# Patient Record
Sex: Female | Born: 1970 | Race: White | Hispanic: No | Marital: Single | Smoking: Never smoker
Health system: Southern US, Community
[De-identification: ages and names within clinical notes are randomized; demographics above are authoritative.]

## PROBLEM LIST (undated history)

## (undated) DIAGNOSIS — R413 Other amnesia: Secondary | ICD-10-CM

## (undated) DIAGNOSIS — G4730 Sleep apnea, unspecified: Secondary | ICD-10-CM

## (undated) DIAGNOSIS — Z87442 Personal history of urinary calculi: Secondary | ICD-10-CM

## (undated) DIAGNOSIS — F419 Anxiety disorder, unspecified: Secondary | ICD-10-CM

## (undated) DIAGNOSIS — F329 Major depressive disorder, single episode, unspecified: Secondary | ICD-10-CM

## (undated) DIAGNOSIS — R600 Localized edema: Secondary | ICD-10-CM

## (undated) DIAGNOSIS — G8929 Other chronic pain: Secondary | ICD-10-CM

## (undated) DIAGNOSIS — U071 COVID-19: Secondary | ICD-10-CM

## (undated) DIAGNOSIS — Z8489 Family history of other specified conditions: Secondary | ICD-10-CM

## (undated) DIAGNOSIS — F32A Depression, unspecified: Secondary | ICD-10-CM

## (undated) DIAGNOSIS — R7989 Other specified abnormal findings of blood chemistry: Secondary | ICD-10-CM

## (undated) DIAGNOSIS — E785 Hyperlipidemia, unspecified: Secondary | ICD-10-CM

## (undated) DIAGNOSIS — L988 Other specified disorders of the skin and subcutaneous tissue: Secondary | ICD-10-CM

## (undated) HISTORY — PX: NO PAST SURGERIES: SHX2092

## (undated) HISTORY — PX: COLONOSCOPY: SHX174

## (undated) HISTORY — PX: WRIST GANGLION EXCISION: SHX840

## (undated) HISTORY — PX: CYSTOSCOPY WITH HOLMIUM LASER LITHOTRIPSY: SHX6639

## (undated) HISTORY — PX: BREAST REDUCTION SURGERY: SHX8

---

## 2008-09-17 ENCOUNTER — Ambulatory Visit: Payer: Self-pay | Admitting: Internal Medicine

## 2011-05-29 ENCOUNTER — Ambulatory Visit: Payer: Self-pay | Admitting: Internal Medicine

## 2013-07-05 ENCOUNTER — Ambulatory Visit: Payer: Self-pay | Admitting: Obstetrics and Gynecology

## 2013-07-05 LAB — CBC
HCT: 35.4 % (ref 35.0–47.0)
HGB: 12 g/dL (ref 12.0–16.0)
MCH: 30.4 pg (ref 26.0–34.0)
MCV: 90 fL (ref 80–100)
Platelet: 283 10*3/uL (ref 150–440)
RBC: 3.95 10*6/uL (ref 3.80–5.20)
RDW: 13.8 % (ref 11.5–14.5)

## 2013-07-05 LAB — COMPREHENSIVE METABOLIC PANEL
Albumin: 3.8 g/dL (ref 3.4–5.0)
Alkaline Phosphatase: 88 U/L (ref 50–136)
Anion Gap: 4 — ABNORMAL LOW (ref 7–16)
BUN: 11 mg/dL (ref 7–18)
Calcium, Total: 8.9 mg/dL (ref 8.5–10.1)
Chloride: 105 mmol/L (ref 98–107)
Creatinine: 0.73 mg/dL (ref 0.60–1.30)
EGFR (Non-African Amer.): >60
Glucose: 90 mg/dL (ref 65–99)
SGPT (ALT): 30 U/L (ref 12–78)

## 2013-07-20 ENCOUNTER — Ambulatory Visit: Payer: Self-pay | Admitting: Obstetrics and Gynecology

## 2013-07-23 LAB — PATHOLOGY REPORT

## 2014-01-10 ENCOUNTER — Ambulatory Visit: Payer: Self-pay | Admitting: Emergency Medicine

## 2014-01-10 LAB — RAPID STREP-A WITH REFLX: Micro Text Report: NEGATIVE

## 2014-01-12 LAB — BETA STREP CULTURE(ARMC)

## 2015-01-03 NOTE — Op Note (Signed)
PATIENT NAME:  Rafael BihariDAY, Corlis C MR#:  [id number] DATE OF BIRTH:  23-Dec-1970  DATE OF PROCEDURE:  07/20/2013  PREOPERATIVE DIAGNOSES:  1.  Abnormal uterine bleeding.  2.  Suspected endometrial polyp.   POSTOPERATIVE DIAGNOSES: 1.  Abnormal uterine bleeding.  2.  Suspected endometrial polyp.   PROCEDURE:  1.  Hysteroscopy.  2.  Dilation and curettage.  3.  Polypectomy.   SURGEON: Thomasene MohairStephen Star Cheese, MD.   ANESTHESIA: General.   ESTIMATED BLOOD LOSS: 50 mL.  OPERATIVE FLUIDS: 900 mL crystalloid.   COMPLICATIONS: None.   FINDINGS:  1.  Multiple polypoid fragments in endometrial cavity.  2.  Normal-appearing uterine cavity contour.   SPECIMENS:  1.  Endometrial curettings.  2.  Polypoid lesions.   CONDITION AT THE END OF PROCEDURE: Stable.   PROCEDURE IN DETAIL: The patient was taken to the operating room where general anesthesia was administered and found to be adequate. She was placed in the dorsal supine high lithotomy position in the candy cane stirrups and prepped and draped in the usual sterile fashion. After timeout was called, her bladder was drained using straight catheterization with an in and out catheter for about 150 mL of clear urine. A sterile speculum placed in the vagina and the anterior lip of the cervix was grasped with a single-tooth tenaculum. The uterus was sounded to the depth of approximately 7 cm. The cervix was gently dilated in a serial fashion using Hegar dilators to a dilatation of approximately 8 mm. The hysteroscope was then gently introduced through the cervix with the above-noted findings. Multiple passes using the curette as well as a Randall stone forceps were utilized to clear the uterine cavity of all polypoid fragments noted. At the end of the procedure, no polypoid fragments remained and hemostasis was noted. At this point, the procedure was terminated. The hysteroscope was removed from the uterus. The tenaculum was removed from the cervix and hemostasis  was found at the tenaculum sites. The speculum was also removed from the vagina and the vagina was verified to be free of any instrumentation or sponges at the end of procedure.   The patient tolerated the procedure well. Sponge, lap, and needle counts were correct x 2. The patient was wearing pneumatic compression stockings for VTE prophylaxis throughout the entire procedure. She was awakened in the operating room and taken to the recovery area in stable condition.   ____________________________ Conard NovakStephen D. Raiza Kiesel, MD sdj:aw D: 07/20/2013 11:52:11 ET T: 07/20/2013 12:11:55 ET JOB#: [id number]  cc: Conard NovakStephen D. Vince Ainsley, MD, <Dictator> Conard NovakSTEPHEN D Olar Santini MD ELECTRONICALLY SIGNED 07/29/2013 11:57

## 2016-06-25 DIAGNOSIS — R0602 Shortness of breath: Secondary | ICD-10-CM | POA: Insufficient documentation

## 2016-06-25 DIAGNOSIS — F439 Reaction to severe stress, unspecified: Secondary | ICD-10-CM | POA: Insufficient documentation

## 2016-06-25 DIAGNOSIS — M7661 Achilles tendinitis, right leg: Secondary | ICD-10-CM | POA: Insufficient documentation

## 2018-01-04 DIAGNOSIS — E785 Hyperlipidemia, unspecified: Secondary | ICD-10-CM | POA: Insufficient documentation

## 2018-05-17 ENCOUNTER — Other Ambulatory Visit: Payer: Self-pay

## 2018-05-17 ENCOUNTER — Ambulatory Visit
Admission: EM | Admit: 2018-05-17 | Discharge: 2018-05-17 | Disposition: A | Discharge status: Home / Self Care | Payer: BC Managed Care – PPO | Attending: Family Medicine | Admitting: Family Medicine

## 2018-05-17 DIAGNOSIS — J40 Bronchitis, not specified as acute or chronic: Secondary | ICD-10-CM

## 2018-05-17 MED ORDER — HYDROCOD POLST-CPM POLST ER 10-8 MG/5ML PO SUER: 5.0000 mL | Freq: Two times a day (BID) | ORAL | 0 refills | Status: DC | PRN

## 2018-05-17 MED ORDER — AZITHROMYCIN 250 MG PO TABS: ORAL_TABLET | ORAL | 0 refills | Status: DC

## 2018-05-17 NOTE — Discharge Instructions (Signed)
Meds as prescribed.  If you fail to improve or worsen, please let us know  Take care  Dr. Adriana Simas

## 2018-05-17 NOTE — ED Provider Notes (Signed)
[facility]    CSN: [id number] Arrival date & time: 05/17/18  1303  History   Chief Complaint Chief Complaint  Patient presents with  . Cough   HPI  47-year-old female presents with cough and fever.  Started Thursday.  She reports cough, congestion, wheezing.  Has had a fever as high as 103.  Has been medicating with Tylenol, NyQuil and DayQuil.  Cough persists.  No reports of shortness of breath.  No known exacerbating factors.  No reported sick contacts.  No other associated symptoms.  No other complaints.  PMH, Surgical Hx, Family Hx, Social History reviewed and updated as below.  PMH: Anxiety    Ganglion cyst    Joint pain    Depression    Uterine polyp ~2012 D&C  Family history of breast cancer  Patien'ts mother had negative BRCA gene testing. Patient had genetic counseling and told no indication for testing.  Nephrolithiasis  Surgical Hx: BREAST SURGERY   reduction   COMBINED HYSTEROSCOPY DIAGNOSTIC / D&C       Home Medications    Prior to Admission medications   Medication Sig Start Date End Date Taking? Authorizing Provider  clonazePAM (KLONOPIN) 0.5 MG tablet TK 1 T PO BID PRA 05/03/18  Yes [provider]  escitalopram (LEXAPRO) 10 MG tablet TK 1 T PO D 04/21/18  Yes [provider]  naproxen (NAPROSYN) 500 MG tablet TK 1 T PO BID PRN 05/02/18  Yes [provider]  azithromycin (ZITHROMAX) 250 MG tablet 2 tablets on [place] 1, then 1 tablet daily on days 2-5. 05/17/18   ,  G, DO  chlorpheniramine-HYDROcodone (TUSSIONEX PENNKINETIC ER) 10-8 MG/5ML SUER Take 5 mLs by mouth every 12 (twelve) hours as needed. 05/17/18   ,  G, DO    Family History Family History  Problem Relation Age of Onset  . Cancer Mother   . Diabetes Father   . Heart attack Father     Social History Social History   Tobacco Use  . Smoking status: Never Smoker  . Smokeless tobacco: Never Used  Substance Use Topics  . Alcohol  use: Yes    Comment: occasionally  . Drug use: Not Currently     Allergies   Penicillin g and Bee venom   Review of Systems Review of Systems  Constitutional: Positive for fever.  HENT: Positive for congestion.   Respiratory: Positive for cough.    Physical Exam Triage Vital Signs ED Triage Vitals  Enc Vitals Group     BP 05/17/18 1316 (!) 141/85     Pulse Rate 05/17/18 1316 87     Resp 05/17/18 1316 18     Temp 05/17/18 1316 98.4 F (36.9 C)     Temp Source 05/17/18 1316 Oral     SpO2 05/17/18 1316 99 %     Weight 05/17/18 1315 252 lb (114.3 kg)     Height 05/17/18 1315 5' 8" (1.727 m)     Head Circumference --      Peak Flow --      Pain Score 05/17/18 1315 4     Pain Loc --      Pain Edu? --      Excl. in GC? --    Updated Vital Signs BP (!) 141/85 (BP Location: Left Arm)   Pulse 87   Temp 98.4 F (36.9 C) (Oral)   Resp 18   Ht 5' 8" (1.727 m)   Wt 114.3 kg   SpO2 99%     BMI 38.32 kg/m   Visual Acuity Right Eye Distance:   Left Eye Distance:   Bilateral Distance:    Right Eye Near:   Left Eye Near:    Bilateral Near:     Physical Exam  Constitutional: She is oriented to person, place, and time. She appears well-developed. No distress.  HENT:  Head: Normocephalic and atraumatic.  Mouth/Throat: Oropharynx is clear and moist.  Cardiovascular: Normal rate and regular rhythm.  Pulmonary/Chest: Effort normal. No respiratory distress. She has no wheezes. She has no rales.  Neurological: She is alert and oriented to person, place, and time.  Psychiatric: She has a normal mood and affect. Her behavior is normal.  Nursing note and vitals reviewed.  UC Treatments / Results  Labs (all labs ordered are listed, but only abnormal results are displayed) Labs Reviewed - No data to display  EKG None  Radiology No results found.  Procedures Procedures (including critical care time)  Medications Ordered in UC Medications - No data to display  Initial  Impression / Assessment and Plan / UC Course  I have reviewed the triage vital signs and the nursing notes.  Pertinent labs & imaging results that were available during my care of the patient were reviewed by me and considered in my medical decision making (see chart for details).    47 year old female presents with suspected bacterial bronchitis versus early pneumonia.  Treating with azithromycin.  Tussionex for cough.  Final Clinical Impressions(s) / UC Diagnoses   Final diagnoses:  Bronchitis     Discharge Instructions     Meds as prescribed.  If you fail to improve or worsen, please let us know  Take care  Dr. Lacinda Axon    ED Prescriptions    Medication Sig [place]. Provider   azithromycin (ZITHROMAX) 250 MG tablet 2 tablets on [place] 1, then 1 tablet daily on days 2-5. 6 tablet Maydelin Deming G, DO   chlorpheniramine-HYDROcodone (TUSSIONEX PENNKINETIC ER) 10-8 MG/5ML SUER Take 5 mLs by mouth every 12 (twelve) hours as needed. 115 mL Coral Spikes, DO     Controlled Substance Prescriptions [place] Controlled Substance Registry consulted? Not Applicable   Coral Spikes, DO 05/17/18 1507

## 2018-05-17 NOTE — ED Triage Notes (Signed)
Patient complains of cough, congestion, wheezing x Thursday.

## 2018-11-04 ENCOUNTER — Ambulatory Visit
Admission: EM | Admit: 2018-11-04 | Discharge: 2018-11-04 | Disposition: A | Discharge status: Home / Self Care | Payer: BC Managed Care – PPO | Attending: Emergency Medicine | Admitting: Emergency Medicine

## 2018-11-04 ENCOUNTER — Other Ambulatory Visit: Payer: Self-pay

## 2018-11-04 DIAGNOSIS — R05 Cough: Secondary | ICD-10-CM | POA: Diagnosis not present

## 2018-11-04 DIAGNOSIS — J101 Influenza due to other identified influenza virus with other respiratory manifestations: Secondary | ICD-10-CM

## 2018-11-04 DIAGNOSIS — R059 Cough, unspecified: Secondary | ICD-10-CM

## 2018-11-04 HISTORY — DX: Major depressive disorder, single episode, unspecified: F32.9

## 2018-11-04 HISTORY — DX: Anxiety disorder, unspecified: F41.9

## 2018-11-04 HISTORY — DX: Depression, unspecified: F32.A

## 2018-11-04 LAB — RAPID INFLUENZA A&B ANTIGENS
Influenza A (ARMC): POSITIVE — AB
Influenza B (ARMC): NEGATIVE

## 2018-11-04 MED ORDER — HYDROCOD POLST-CPM POLST ER 10-8 MG/5ML PO SUER: 5.0000 mL | Freq: Two times a day (BID) | ORAL | 0 refills | Status: AC | PRN

## 2018-11-04 NOTE — ED Provider Notes (Signed)
[facility]    CSN: [id number] Arrival date & time: 11/04/18  1008     History   Chief Complaint Chief Complaint  Patient presents with  . Cough  . Headache    HPI Erika Roberts is a 48 y.o. female. Patient presents for 3 Gu history of fever (up to 101 degrees at onset, no elevated temps in the last 24 hours), body aches, headaches, cough, and mild sore throat. She denies chest pain/pressure or breathing difficulties. She has been taking Tylenol every few hours for the headache and body aches. She is otherwise healthy w/o any history of pulmonary or cardiac disease. Denies recent travel outside [place].   HPI  Past Medical History:  Diagnosis Date  . Anxiety   . Depression     There are no active problems to display for this patient.   Past Surgical History:  Procedure Laterality Date  . NO PAST SURGERIES      OB History   No obstetric history on file.      Home Medications    Prior to Admission medications   Medication Sig Start Date End Date Taking? Authorizing Provider  azithromycin (ZITHROMAX) 250 MG tablet 2 tablets on [place] 1, then 1 tablet daily on days 2-5. 05/17/18   Tommie Sams, DO  chlorpheniramine-HYDROcodone (TUSSIONEX PENNKINETIC ER) 10-8 MG/5ML SUER Take 5 mLs by mouth every 12 (twelve) hours as needed for up to 7 days for cough. 11/04/18 11/11/18  Shirlee Latch, PA-C  clonazePAM (KLONOPIN) 0.5 MG tablet TK 1 T PO BID PRA 05/03/18   [provider]  escitalopram (LEXAPRO) 10 MG tablet TK 1 T PO D 04/21/18   [provider]  naproxen (NAPROSYN) 500 MG tablet TK 1 T PO BID PRN 05/02/18   [provider]    Family History Family History  Problem Relation Age of Onset  . Cancer Mother   . Diabetes Father   . Heart attack Father     Social History Social History   Tobacco Use  . Smoking status: Never Smoker  . Smokeless tobacco: Never Used  Substance Use Topics  . Alcohol use: Yes    Comment: occasionally  .  Drug use: Not Currently     Allergies   Penicillin g and Bee venom   Review of Systems Review of Systems  Constitutional: Positive for chills, fatigue and fever.  HENT: Positive for congestion and sore throat. Negative for ear pain, rhinorrhea, sinus pressure, sinus pain and trouble swallowing.   Eyes: Negative for visual disturbance.  Respiratory: Positive for cough. Negative for chest tightness, shortness of breath and wheezing.   Cardiovascular: Negative for chest pain.  Gastrointestinal: Negative for abdominal pain, nausea and vomiting.  Musculoskeletal: Positive for arthralgias, back pain and myalgias.  Skin: Negative for rash.  Neurological: Positive for headaches. Negative for dizziness and weakness.     Physical Exam Triage Vital Signs ED Triage Vitals  Enc Vitals Group     BP 11/04/18 1019 (!) 145/91     Pulse Rate 11/04/18 1019 (!) 115     Resp 11/04/18 1019 20     Temp 11/04/18 1019 98.4 F (36.9 C)     Temp Source 11/04/18 1019 Oral     SpO2 11/04/18 1019 98 %     Weight 11/04/18 1020 255 lb (115.7 kg)     Height 11/04/18 1020  (1.727 m)     Head Circumference --      Peak  Flow --      Pain Score 11/04/18 1020 8     Pain Loc --      Pain Edu? --      Excl. in GC? --    No data found.  Updated Vital Signs BP (!) 145/91 (BP Location: Left Arm)   Pulse (!) 115   Temp 98.4 F (36.9 C) (Oral)   Resp 20   Ht 5\' 8"  (1.727 m)   Wt 255 lb (115.7 kg)   LMP 10/24/2018   SpO2 98%   BMI 38.77 kg/m    Physical Exam Vitals signs and nursing note reviewed.  Constitutional:      General: She is not in acute distress.    Appearance: She is obese. She is ill-appearing.  HENT:     Head: Normocephalic and atraumatic.     Right Ear: Tympanic membrane, ear canal and external ear normal.     Left Ear: Tympanic membrane, ear canal and external ear normal.     Nose: Nose normal. No congestion or rhinorrhea.     Mouth/Throat:     Mouth: Mucous membranes are  moist.     Pharynx: Oropharynx is clear. No posterior oropharyngeal erythema.  Eyes:     General: No scleral icterus.       Right eye: No discharge.        Left eye: No discharge.     Extraocular Movements: Extraocular movements intact.  Neck:     Musculoskeletal: Neck supple.  Cardiovascular:     Rate and Rhythm: Regular rhythm. Tachycardia present.     Heart sounds: No murmur.  Pulmonary:     Effort: Pulmonary effort is normal. No respiratory distress.     Breath sounds: Normal breath sounds. No wheezing or rhonchi.  Lymphadenopathy:     Cervical: Cervical adenopathy (anterior lymph nodes enlarged bilat) present.  Skin:    General: Skin is warm and dry.     Findings: No rash.  Neurological:     General: No focal deficit present.     Mental Status: She is alert and oriented to person, place, and time.     Motor: No weakness.     Gait: Gait normal.  Psychiatric:        Mood and Affect: Mood normal.        Behavior: Behavior normal.        Thought Content: Thought content normal.      UC Treatments / Results  Labs (all labs ordered are listed, but only abnormal results are displayed) Labs Reviewed  RAPID INFLUENZA A&B ANTIGENS ([facility] ONLY) - Abnormal; Notable for the following components:      Result Value   Influenza A ([facility]) POSITIVE (*)    All other components within normal limits    EKG None  Radiology No results found.  Procedures Procedures (including critical care time)  Medications Ordered in UC Medications - No data to display  Initial Impression / Assessment and Plan / UC Course  I have reviewed the triage vital signs and the nursing notes.  Pertinent labs & imaging results that were available during my care of the patient were reviewed by me and considered in my medical decision making (see chart for details).     Final Clinical Impressions(s) / UC Diagnoses   Final diagnoses:  Influenza A  Cough     Discharge Instructions       INFLUENZA:  Your flu test was positive for influenza A today. You have  been ill for 3 days so you are technically out of the window for treatment with Tamiflu. It has shown to not be very helpful if started >48 hours after symptoms begin. You should rest and increase fluid intake. Continue Tylenol and/or Motrin for fever, body aches, and headache. I have sent in a prescription for tussionex for you to take since the OTC cough medications do not seem to be helping. If you have any worsening of symptoms, including fever that continues after 5-6 days of symptoms, chest pain, breathing trouble, severe nausea/vomiting, severe headache, or lethargy, you should be seen again either by PCP, urgent care or ER if much worse. Most people feel better 1-2 weeks after symptoms begin.    ED Prescriptions    Medication Sig Dispense Auth. Provider   chlorpheniramine-HYDROcodone (TUSSIONEX PENNKINETIC ER) 10-8 MG/5ML SUER Take 5 mLs by mouth every 12 (twelve) hours as needed for up to 7 days for cough. 140 mL Eusebio Friendly B, PA-C     Controlled Substance Prescriptions [place] Controlled Substance Registry consulted? Yes, I have consulted the [place] Controlled Substances Registry for this patient, and feel the risk/benefit ratio today is favorable for proceeding with this prescription for a controlled substance.   Shirlee Latch, PA-C 11/04/18 1057

## 2018-11-04 NOTE — ED Triage Notes (Signed)
Sx started on Wednesday. Fever, cough, headache, bodyaches. Pain 8/10

## 2018-11-04 NOTE — Discharge Instructions (Addendum)
INFLUENZA:  Your flu test was positive for influenza A today. You have been ill for 3 days so you are technically out of the window for treatment with Tamiflu. It has shown to not be very helpful if started >48 hours after symptoms begin. You should rest and increase fluid intake. Continue Tylenol and/or Motrin for fever, body aches, and headache. I have sent in a prescription for tussionex for you to take since the OTC cough medications do not seem to be helping. If you have any worsening of symptoms, including fever that continues after 5-6 days of symptoms, chest pain, breathing trouble, severe nausea/vomiting, severe headache, or lethargy, you should be seen again either by PCP, urgent care or ER if much worse. Most people feel better 1-2 weeks after symptoms begin.

## 2019-01-22 DIAGNOSIS — G5602 Carpal tunnel syndrome, left upper limb: Secondary | ICD-10-CM | POA: Insufficient documentation

## 2019-01-22 DIAGNOSIS — M5431 Sciatica, right side: Secondary | ICD-10-CM | POA: Insufficient documentation

## 2019-07-06 DIAGNOSIS — M545 Low back pain, unspecified: Secondary | ICD-10-CM | POA: Insufficient documentation

## 2019-07-06 DIAGNOSIS — N200 Calculus of kidney: Secondary | ICD-10-CM | POA: Insufficient documentation

## 2019-10-15 DIAGNOSIS — G5711 Meralgia paresthetica, right lower limb: Secondary | ICD-10-CM | POA: Insufficient documentation

## 2020-04-21 ENCOUNTER — Other Ambulatory Visit: Payer: Self-pay | Admitting: Chiropractic Medicine

## 2020-04-21 ENCOUNTER — Ambulatory Visit
Admission: RE | Admit: 2020-04-21 | Discharge: 2020-04-21 | Disposition: A | Discharge status: Home / Self Care | Payer: BC Managed Care – PPO | Source: Ambulatory Visit | Attending: Chiropractic Medicine | Admitting: Chiropractic Medicine

## 2020-04-21 ENCOUNTER — Other Ambulatory Visit: Payer: Self-pay

## 2020-04-21 DIAGNOSIS — M545 Low back pain, unspecified: Secondary | ICD-10-CM

## 2020-10-29 ENCOUNTER — Other Ambulatory Visit: Payer: Self-pay

## 2020-10-29 ENCOUNTER — Ambulatory Visit
Admission: RE | Admit: 2020-10-29 | Discharge: 2020-10-29 | Disposition: A | Discharge status: Home / Self Care | Payer: BC Managed Care – PPO | Source: Ambulatory Visit

## 2020-10-29 VITALS — BP 131/85 | HR 86 | Temp 98.0°F | Resp 18 | Ht 68.0 in | Wt 260.0 lb

## 2020-10-29 DIAGNOSIS — N61 Mastitis without abscess: Secondary | ICD-10-CM | POA: Diagnosis not present

## 2020-10-29 HISTORY — DX: Other chronic pain: G89.29

## 2020-10-29 MED ORDER — CLINDAMYCIN HCL 300 MG PO CAPS: 300.0000 mg | ORAL_CAPSULE | Freq: Four times a day (QID) | ORAL | 0 refills | Status: DC

## 2020-10-29 NOTE — ED Provider Notes (Signed)
[facility]    CSN: [id number] Arrival date & time: 10/29/20  1341      History   Chief Complaint Chief Complaint  Patient presents with  . Cellulitis  . Appointment    HPI Erika Roberts is a 50 y.o. female who presents with possible infection of R breast. She had been itching one Hyun 2 weeks ago and noticed wetness on her braw, when she looked saw blisters. She showed it to a friend who is an Char[employer] fundraiser and she told her it looked like shingles. Since then the blisters resolved, but this week noticed redness and warmth. The rash did not go to lateral breast or thorax and denies having pain on this dermatome area before the rash appeared. Had mid thoracic central back pain,  A few days prior to the rash but that resolved.   Breast cancer runs on her mother's side whom sister, mother and maternal aunt had it. Pt is up to date on her mammogram   Past Medical History:  Diagnosis Date  . Anxiety   . Chronic back pain   . Depression     There are no problems to display for this patient.   Past Surgical History:  Procedure Laterality Date  . BREAST REDUCTION SURGERY      OB History   No obstetric history on file.      Home Medications    Prior to Admission medications   Medication Sig Start Date End Date Taking? Authorizing Provider  clindamycin (CLEOCIN) 300 MG capsule Take 1 capsule (300 mg total) by mouth 4 (four) times daily. 10/29/20  Yes Rodriguez-Southworth, Nettie Elm, PA-C  clonazePAM (KLONOPIN) 0.5 MG tablet TK 1 T PO BID PRA 05/03/18  Yes [provider]  escitalopram (LEXAPRO) 10 MG tablet TK 1 T PO D 04/21/18  Yes [provider]  meloxicam (MOBIC) 7.5 MG tablet Take 7.5 mg by mouth daily. 10/02/20  Yes [provider]  metaxalone (SKELAXIN) 800 MG tablet Take 1 tablet by mouth 2 (two) times daily as needed for pain. 09/22/20  Yes [provider]    Family History Family History  Problem Relation Age of Onset  . Cancer Mother         lung, breast, brain  . Diabetes Father   . Heart attack Father     Social History Social History   Tobacco Use  . Smoking status: Never Smoker  . Smokeless tobacco: Never Used  Vaping Use  . Vaping Use: Never used  Substance Use Topics  . Alcohol use: Yes    Comment: occasionally  . Drug use: Not Currently     Allergies   Penicillin g and Bee venom   Review of Systems Review of Systems  Constitutional: Negative for chills, diaphoresis and fever.  Musculoskeletal: Negative for myalgias.  Skin: Positive for color change, rash and wound.  Hematological: Negative for adenopathy.     Physical Exam Triage Vital Signs ED Triage Vitals  Enc Vitals Group     BP 10/29/20 1419 131/85     Pulse Rate 10/29/20 1419 86     Resp 10/29/20 1419 18     Temp 10/29/20 1419 98 F (36.7 C)     Temp Source 10/29/20 1419 Oral     SpO2 10/29/20 1419 100 %     Weight 10/29/20 1420 260 lb (117.9 kg)     Height 10/29/20 1420 5\' 8"  (1.727 m)     Head Circumference --  Peak Flow --      Pain Score 10/29/20 1418 4     Pain Loc --      Pain Edu? --      Excl. in GC? --    No data found.  Updated Vital Signs BP 131/85 (BP Location: Right Arm)   Pulse 86   Temp 98 F (36.7 C) (Oral)   Resp 18   Ht 5\' 8"  (1.727 m)   Wt 260 lb (117.9 kg)   LMP 10/06/2020 (Approximate)   SpO2 100%   BMI 39.53 kg/m   Visual Acuity Right Eye Distance:   Left Eye Distance:   Bilateral Distance:    Right Eye Near:   Left Eye Near:    Bilateral Near:     Physical Exam Vitals and nursing note reviewed.  Constitutional:      General: She is not in acute distress.    Appearance: She is obese. She is not toxic-appearing.  HENT:     Right Ear: External ear normal.     Left Ear: External ear normal.  Eyes:     General: No scleral icterus.    Conjunctiva/sclera: Conjunctivae normal.  Pulmonary:     Effort: Pulmonary effort is normal.  Chest:  Breasts:     Right: No axillary  adenopathy.    Musculoskeletal:     Cervical back: Neck supple.  Lymphadenopathy:     Upper Body:     Right upper body: No axillary adenopathy.  Skin:    General: Skin is warm and dry.     Comments: R BREAST- with healing scab of about 4 cm x 2 cm with erythema around it on lower central breast below nipple. No lumps palpated. Central erythema area has orange peel look and is hot. No streaking to the axilla noted.   Neurological:     Mental Status: She is alert and oriented to person, place, and time.     Gait: Gait normal.  Psychiatric:        Mood and Affect: Mood normal.        Behavior: Behavior normal.        Thought Content: Thought content normal.        Judgment: Judgment normal.      UC Treatments / Results  Labs (all labs ordered are listed, but only abnormal results are displayed) Labs Reviewed - No data to display  EKG   Radiology No results found.  Procedures Procedures (including critical care time)  Medications Ordered in UC Medications - No data to display  Initial Impression / Assessment and Plan / UC Course  I have reviewed the triage vital signs and the nursing notes. Pertinent labs & imaging results that were available during my care of the patient were reviewed by me and considered in my medical decision making (see chart for details). She showed me the picture of what the initial rash looked like and was vesicular only on the R central breast below the areola. Since it did not follow a dermatome I am not 100% sure this was shingles, but now has secondary infection. I placed her on Clindamycin as noted and strongly advised to FU with PCP if skin is not back to normal when she is done with the antimitotics. I educated her a little about inflammatory breast cancer and encouraged her to read about  This condition.  Final Clinical Impressions(s) / UC Diagnoses   Final diagnoses:  Cellulitis of right breast  Discharge Instructions     Please  make to have a follow up with your primary care doctor if this does not fully resolve at the end of the antibiotic treatment.  Read about Inflammatory breast cancer since breast cancer runs in your family.     ED Prescriptions    Medication Sig Dispense Auth. Provider   clindamycin (CLEOCIN) 300 MG capsule Take 1 capsule (300 mg total) by mouth 4 (four) times daily. 40 capsule Rodriguez-Southworth, Nettie Elm, PA-C     PDMP not reviewed this encounter.   Garey Ham, PA-C 10/29/20 1643

## 2020-10-29 NOTE — Discharge Instructions (Signed)
Please make to have a follow up with your primary care doctor if this does not fully resolve at the end of the antibiotic treatment.  Read about Inflammatory breast cancer since breast cancer runs in your family.

## 2020-10-29 NOTE — ED Triage Notes (Signed)
Patient in today c/o rash/cellulitis x 2 weeks on her right breast. Patient states that it started as shingles 2 weeks ago. Patient took Zovirax x 7 days. Patient states the area is now red, itching and painful.

## 2021-11-12 IMAGING — CR DG LUMBAR SPINE COMPLETE W/ BEND
7 series · 7 of 7 positions shown · non-contrast
Comparison: None.

CLINICAL DATA: Neck pain. Lumbosacral back pain radiating into
legs. Previous spine trauma. Patient reports fall on head 35 years
ago.

EXAM:
LUMBAR SPINE - COMPLETE WITH BENDING VIEWS

[w lumbar spine ap (1 of 2)]
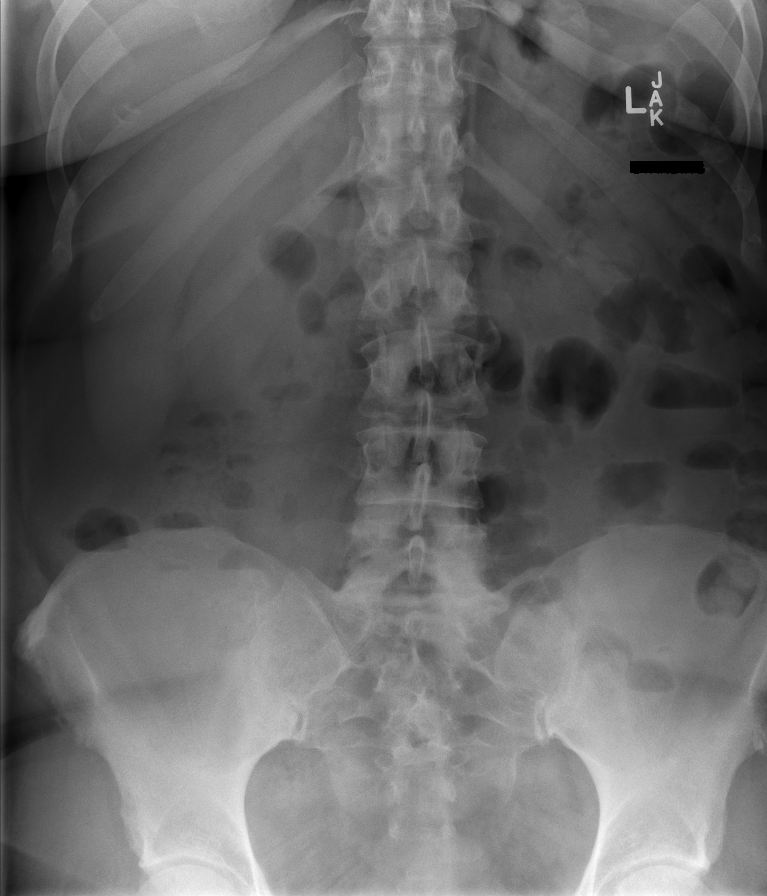

[w lumbar spine flexion (1 of 2)]
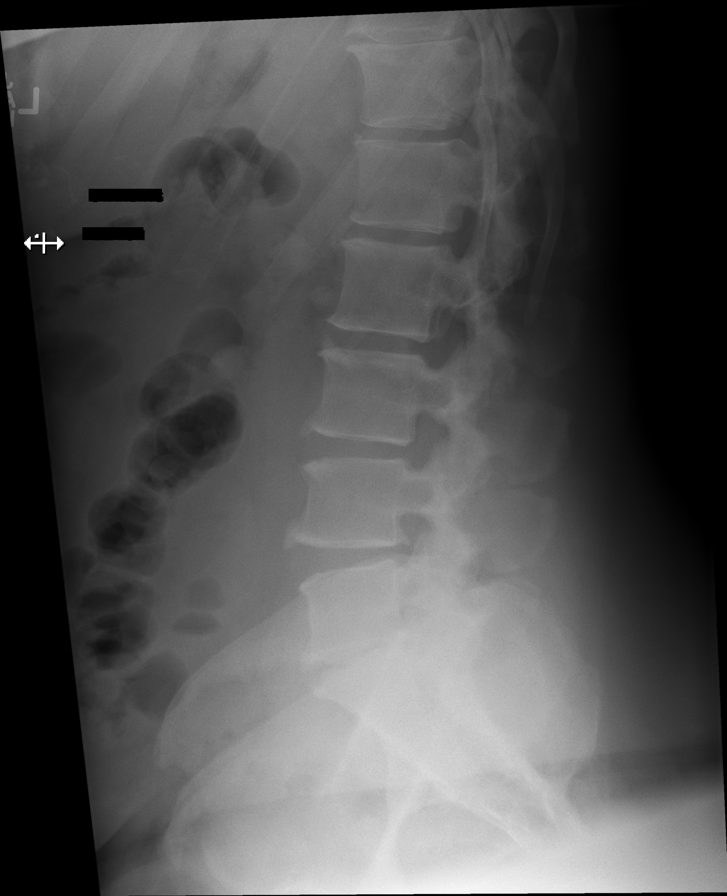

[w lumbar spine flexion (2 of 2)]
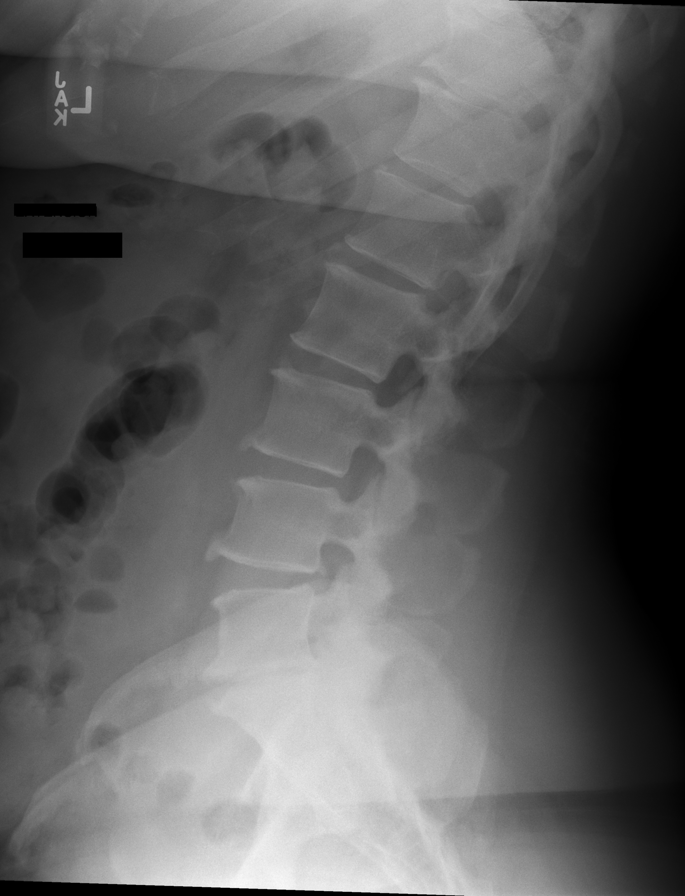

[w lumbar spine lat]
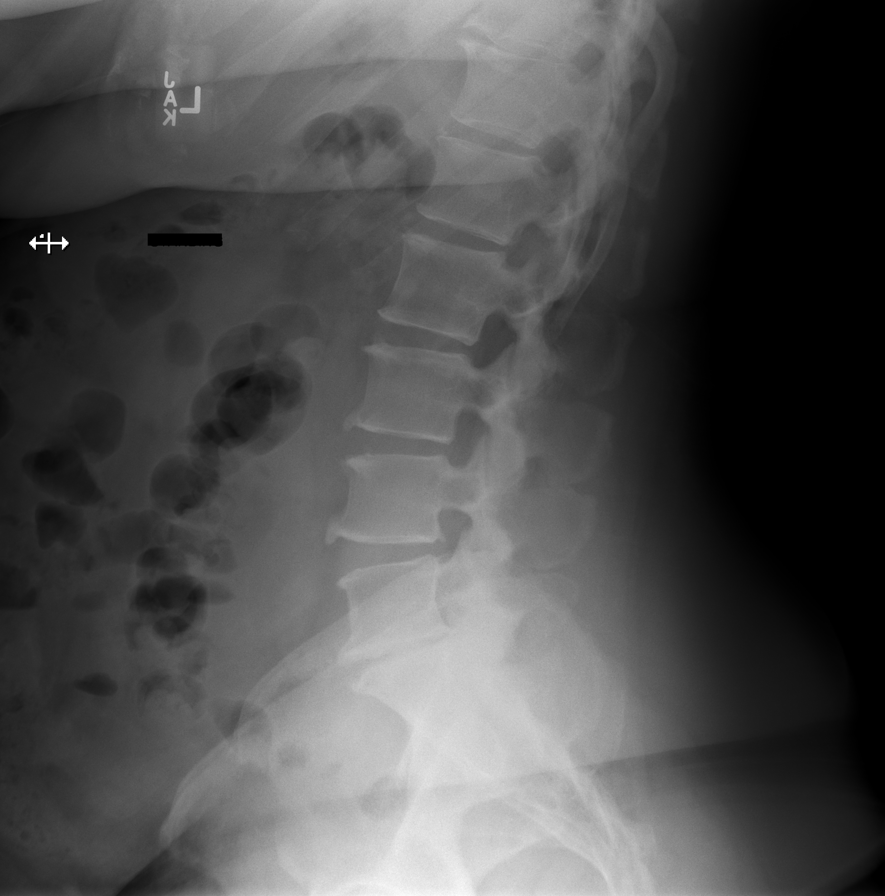

[w lumbar spine ap (2 of 2)]
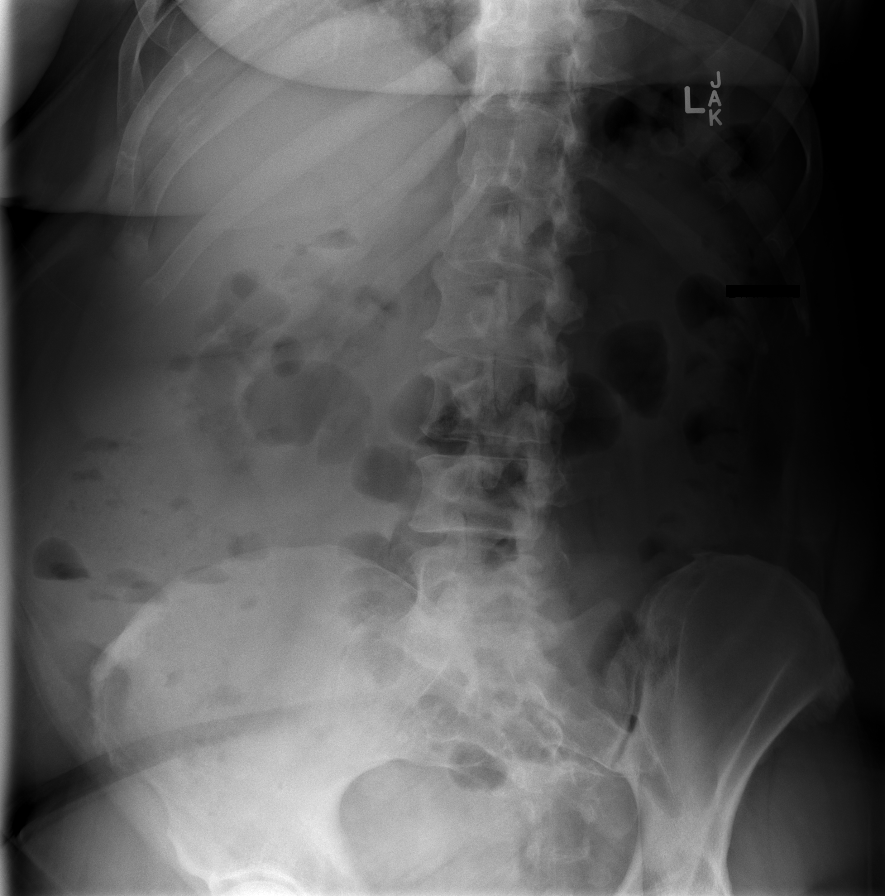

[w lumbar spine extension]
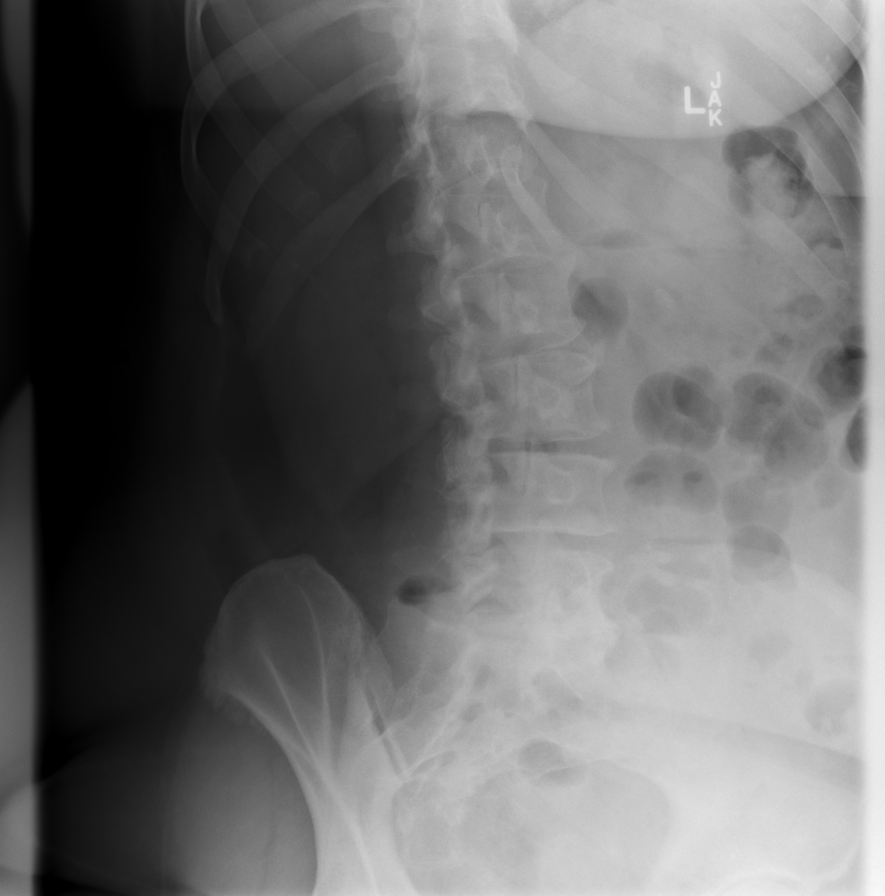

[w lumbar l-5 s-1 spot]
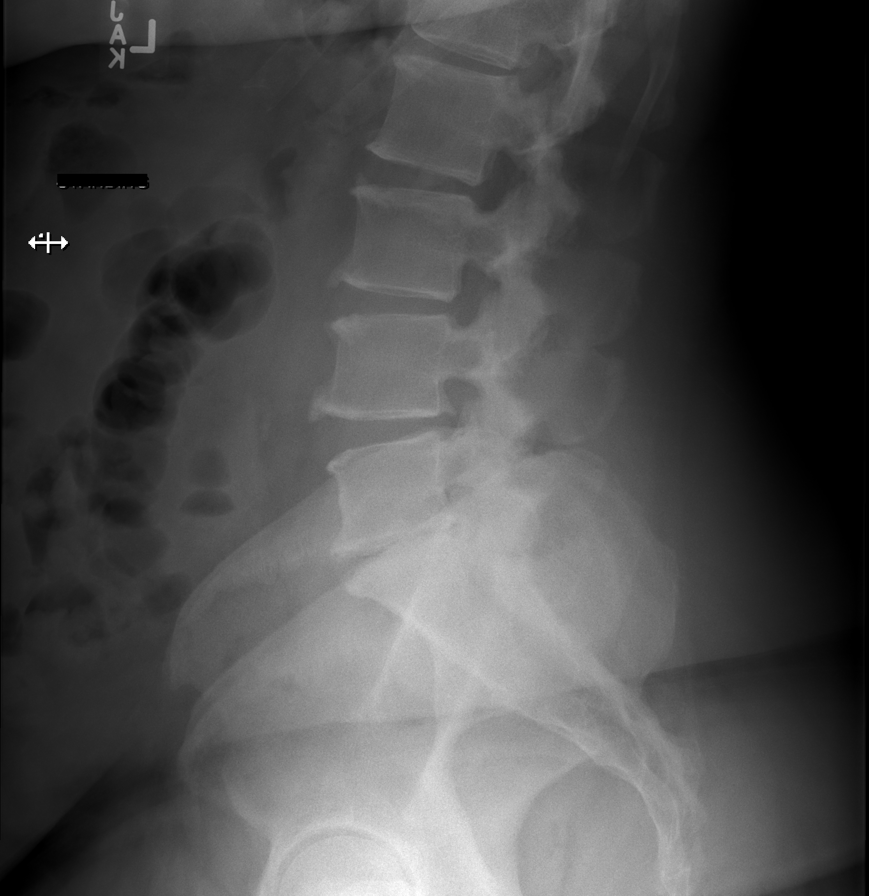

[7 of 7 positions shown; findings below may reference images not displayed]

FINDINGS: Imaging obtained standing. Normal alignment. No abnormal motion on
flexion or extension. No listhesis. Vertebral body heights are
normal. Multilevel endplate spurring. There is disc space narrowing
most prominent at L5-S1, to a lesser extent L3-L4 and L2-L3. Mild
lower lumbar facet hypertrophy. No evidence of fracture or focal
bone lesion. Mild degenerative change of both sacroiliac joints.
IMPRESSION: 1. Multilevel degenerative disc disease and facet hypertrophy.
Degenerative disc disease is most prominent at L5-S1.
2. No abnormal motion on flexion or extension.

## 2022-05-04 DIAGNOSIS — E6609 Other obesity due to excess calories: Secondary | ICD-10-CM | POA: Insufficient documentation

## 2022-05-04 DIAGNOSIS — M2141 Flat foot [pes planus] (acquired), right foot: Secondary | ICD-10-CM | POA: Insufficient documentation

## 2022-11-17 ENCOUNTER — Ambulatory Visit
Admission: EM | Admit: 2022-11-17 | Discharge: 2022-11-17 | Disposition: A | Discharge status: Home / Self Care | Payer: BC Managed Care – PPO

## 2022-11-17 ENCOUNTER — Encounter: Payer: Self-pay | Admitting: Emergency Medicine

## 2022-11-17 DIAGNOSIS — J209 Acute bronchitis, unspecified: Secondary | ICD-10-CM

## 2022-11-17 DIAGNOSIS — R051 Acute cough: Secondary | ICD-10-CM

## 2022-11-17 MED ORDER — AZITHROMYCIN 250 MG PO TABS: 250.0000 mg | ORAL_TABLET | Freq: Every day | ORAL | 0 refills | Status: DC

## 2022-11-17 MED ORDER — PREDNISONE 20 MG PO TABS: 40.0000 mg | ORAL_TABLET | Freq: Every day | ORAL | 0 refills | Status: AC

## 2022-11-17 MED ORDER — PROMETHAZINE-DM 6.25-15 MG/5ML PO SYRP: 5.0000 mL | ORAL_SOLUTION | Freq: Four times a day (QID) | ORAL | 0 refills | Status: DC | PRN

## 2022-11-17 NOTE — Discharge Instructions (Addendum)
-  Symptoms most likely viral.  I sent prednisone and cough medication to the pharmacy.  You should be feeling better over the next week but if you are not you can fill and take the prescription for the antibiotic.  If at any point you have fever, breathing difficulty or weakness please return for reevaluation.

## 2022-11-17 NOTE — ED Provider Notes (Signed)
[facility]    CSN: [id number] Arrival date & time: 11/17/22  1120      History   Chief Complaint Chief Complaint  Patient presents with   Cough    HPI Erika Roberts is a 52 y.o. female presenting for 2-week history of dry cough, voice hoarseness, congestion.  Reports wheezing at night and occasional shortness of breath.  Denies fever, sinus pain.  Mild sore throat.  No sick contacts.  No improvement or worsening of symptoms from onset.  No history of asthma or COPD and patient is a non-smoker.  No sick contacts.  No relief with OTC meds.  HPI  Past Medical History:  Diagnosis Date   Anxiety    Chronic back pain    Depression     There are no problems to display for this patient.   Past Surgical History:  Procedure Laterality Date   BREAST REDUCTION SURGERY      OB History   No obstetric history on file.      Home Medications    Prior to Admission medications   Medication Sig Start Date End Date Taking? Authorizing Provider  azithromycin (ZITHROMAX) 250 MG tablet Take 1 tablet (250 mg total) by mouth daily. Take first 2 tablets together, then 1 every Marchi until finished. 11/17/22  Yes Laurene Footman B, PA-C  clonazePAM (KLONOPIN) 0.5 MG tablet TK 1 T PO BID PRA 05/03/18  Yes [provider]  escitalopram (LEXAPRO) 10 MG tablet TK 1 T PO D 04/21/18  Yes [provider]  meloxicam (MOBIC) 7.5 MG tablet Take 7.5 mg by mouth daily. 10/02/20  Yes [provider]  metaxalone (SKELAXIN) 800 MG tablet Take 1 tablet by mouth 2 (two) times daily as needed for pain. 09/22/20  Yes [provider]  predniSONE (DELTASONE) 20 MG tablet Take 2 tablets (40 mg total) by mouth daily for 5 days. 11/17/22 11/22/22 Yes Danton Clap, PA-C  pregabalin (LYRICA) 75 MG capsule Take by mouth. 06/08/22 06/08/23 Yes [provider]  promethazine-dextromethorphan (PROMETHAZINE-DM) 6.25-15 MG/5ML syrup Take 5 mLs by mouth 4 (four) times daily as needed.  11/17/22  Yes Laurene Footman B, PA-C  tiZANidine (ZANAFLEX) 4 MG capsule Take 4 mg by mouth 3 (three) times daily.   Yes [provider]    Family History Family History  Problem Relation Age of Onset   Cancer Mother        lung, breast, brain   Diabetes Father    Heart attack Father     Social History Social History   Tobacco Use   Smoking status: Never   Smokeless tobacco: Never  Vaping Use   Vaping Use: Never used  Substance Use Topics   Alcohol use: Yes    Comment: occasionally   Drug use: Not Currently     Allergies   Penicillin g, Gabapentin, and Bee venom   Review of Systems Review of Systems  Constitutional:  Positive for fatigue. Negative for chills, diaphoresis and fever.  HENT:  Positive for congestion. Negative for ear pain, rhinorrhea, sinus pressure, sinus pain and sore throat.   Respiratory:  Positive for cough and wheezing. Negative for shortness of breath.   Cardiovascular:  Negative for chest pain.  Gastrointestinal:  Negative for abdominal pain, nausea and vomiting.  Musculoskeletal:  Negative for arthralgias and myalgias.  Skin:  Negative for rash.  Neurological:  Positive for headaches. Negative for weakness.  Hematological:  Negative for adenopathy.     Physical  Exam Triage Vital Signs ED Triage Vitals  Enc Vitals Group     BP      Pulse      Resp      Temp      Temp src      SpO2      Weight      Height      Head Circumference      Peak Flow      Pain Score      Pain Loc      Pain Edu?      Excl. in [place]?    No data found.  Updated Vital Signs BP 129/85 (BP Location: Left Arm)   Pulse 71   Temp 98.4 F (36.9 C) (Oral)   Resp 16   SpO2 97%      Physical Exam Vitals and nursing note reviewed.  Constitutional:      General: She is not in acute distress.    Appearance: Normal appearance. She is not ill-appearing or toxic-appearing.     Comments: Mild voice hoarseness  HENT:     Head: Normocephalic and  atraumatic.     Nose: Congestion present.     Mouth/Throat:     Mouth: Mucous membranes are moist.     Pharynx: Oropharynx is clear.  Eyes:     General: No scleral icterus.       Right eye: No discharge.        Left eye: No discharge.     Conjunctiva/sclera: Conjunctivae normal.  Cardiovascular:     Rate and Rhythm: Normal rate and regular rhythm.     Heart sounds: Normal heart sounds.  Pulmonary:     Effort: Pulmonary effort is normal. No respiratory distress.     Breath sounds: Rhonchi (bilateral upper lung fields) present.  Musculoskeletal:     Cervical back: Neck supple.  Skin:    General: Skin is dry.  Neurological:     General: No focal deficit present.     Mental Status: She is alert. Mental status is at baseline.     Motor: No weakness.     Gait: Gait normal.  Psychiatric:        Mood and Affect: Mood normal.        Behavior: Behavior normal.        Thought Content: Thought content normal.      UC Treatments / Results  Labs (all labs ordered are listed, but only abnormal results are displayed) Labs Reviewed - No data to display  EKG   Radiology No results found.  Procedures Procedures (including critical care time)  Medications Ordered in UC Medications - No data to display  Initial Impression / Assessment and Plan / UC Course  I have reviewed the triage vital signs and the nursing notes.  Pertinent labs & imaging results that were available during my care of the patient were reviewed by me and considered in my medical decision making (see chart for details).   52 year old female presents for 2-week history of cough and congestion as well as voice hoarseness and fatigue.  No fever.  Occasional shortness of breath and wheezing.  Vitals normal and stable.  She is overall well-appearing and in no acute distress.  On exam she has nasal congestion few scattered rhonchi throughout bilateral upper lung fields, more so on the right.  Likely viral bronchitis.   Supportive care encouraged.  Sent prednisone and Promethazine DM to pharmacy.  Advised rest and fluids.  Advised  if no improvement in a week then she may fill and take the azithromycin in case she is developing secondary bacterial infection.  Reviewed return precautions.   Final Clinical Impressions(s) / UC Diagnoses   Final diagnoses:  Acute bronchitis, unspecified organism  Acute cough     Discharge Instructions      -Symptoms most likely viral.  I sent prednisone and cough medication to the pharmacy.  You should be feeling better over the next week but if you are not you can fill and take the prescription for the antibiotic.  If at any point you have fever, breathing difficulty or weakness please return for reevaluation.     ED Prescriptions     Medication Sig Dispense Auth. Provider   predniSONE (DELTASONE) 20 MG tablet Take 2 tablets (40 mg total) by mouth daily for 5 days. 10 tablet Danton Clap, PA-C   promethazine-dextromethorphan (PROMETHAZINE-DM) 6.25-15 MG/5ML syrup Take 5 mLs by mouth 4 (four) times daily as needed. 118 mL Laurene Footman B, PA-C   azithromycin (ZITHROMAX) 250 MG tablet Take 1 tablet (250 mg total) by mouth daily. Take first 2 tablets together, then 1 every C[place] until finished. 6 tablet Gretta Cool      PDMP not reviewed this encounter.   Danton Clap, PA-C 11/17/22 1215

## 2022-11-17 NOTE — ED Triage Notes (Signed)
Pt presents with a non productive cough and SOB at night x 2 weeks. Pt has tried OTC medication with no relief.

## 2023-01-10 ENCOUNTER — Inpatient Hospital Stay
Admission: RE | Admit: 2023-01-10 | Discharge: 2023-01-10 | Disposition: A | Discharge status: Home / Self Care | Payer: Self-pay | Source: Ambulatory Visit | Attending: Neurosurgery | Admitting: Neurosurgery

## 2023-01-10 ENCOUNTER — Other Ambulatory Visit: Payer: Self-pay

## 2023-01-10 DIAGNOSIS — Z049 Encounter for examination and observation for unspecified reason: Secondary | ICD-10-CM

## 2023-01-17 DIAGNOSIS — R159 Full incontinence of feces: Secondary | ICD-10-CM | POA: Insufficient documentation

## 2023-01-17 DIAGNOSIS — R197 Diarrhea, unspecified: Secondary | ICD-10-CM | POA: Insufficient documentation

## 2023-01-17 DIAGNOSIS — R194 Change in bowel habit: Secondary | ICD-10-CM | POA: Insufficient documentation

## 2023-01-26 NOTE — Progress Notes (Unsigned)
Referring Physician:  Lanice Shirts, MD [address] [place],  [place] [address]  Primary Physician:  Lanice Shirts, MD  History of Present Illness: 01/27/2023 Erika Roberts is here today with a chief complaint of right leg pain.  She has been having pain for several years.  She did physical therapy 2 years ago without improvement.  She has sharp and stabbing pain that goes down her right leg into her hip, posterior lateral thigh, posterior and lateral calf, and on the lateral aspect of her right foot.  She has some numbness in her leg as well in a similar distribution.  This is made worse by walking and standing.  Sitting sometimes makes it worse.  Nothing has really helped.  She has undergone multiple injections for this without satisfactory results.   Bowel/Bladder Dysfunction: none  Conservative measures:  Physical therapy:  has participated in [facility] 2 years [facility]  Multimodal medical therapy including regular antiinflammatories:  meloxicam, metaxalone, lyrica, tizanidine, oxycodone Injections:  has received epidural steroid injections 09/2022 last only 4 days   Past Surgery: denies  Erika Roberts has no symptoms of cervical myelopathy.  The symptoms are causing a significant impact on the patient's life.   I have utilized the care everywhere function in epic to review the outside records available from external health systems.  Review of Systems:  A 10 point review of systems is negative, except for the pertinent positives and negatives detailed in the HPI.  Past Medical History: Past Medical History:  Diagnosis Date   Anxiety    Chronic back pain    Depression     Past Surgical History: Past Surgical History:  Procedure Laterality Date   BREAST REDUCTION SURGERY      Allergies: Allergies as of 01/27/2023 - Review Complete 01/27/2023  Allergen Reaction Noted   Penicillin g Hives 05/17/2018   Gabapentin Other (See Comments) and Swelling 12/04/2021    Bee venom  06/25/2016    Medications:  Current Outpatient Medications:    clonazePAM (KLONOPIN) 0.5 MG tablet, TK 1 T PO BID PRA, Disp: , Rfl: 1   escitalopram (LEXAPRO) 10 MG tablet, 20 mg. TAKING 20 MG TOTAL, Disp: , Rfl: 3   meloxicam (MOBIC) 7.5 MG tablet, Take 7.5 mg by mouth daily., Disp: , Rfl:    metaxalone (SKELAXIN) 800 MG tablet, Take 1 tablet by mouth 2 (two) times daily as needed for pain., Disp: , Rfl:    pregabalin (LYRICA) 75 MG capsule, Take by mouth., Disp: , Rfl:    tiZANidine (ZANAFLEX) 4 MG capsule, Take 4 mg by mouth 3 (three) times daily., Disp: , Rfl:   Social History: Social History   Tobacco Use   Smoking status: Never   Smokeless tobacco: Never  Vaping Use   Vaping Use: Never used  Substance Use Topics   Alcohol use: Yes    Comment: occasionally   Drug use: Not Currently    Family Medical History: Family History  Problem Relation Age of Onset   Cancer Mother        lung, breast, brain   Diabetes Father    Heart attack Father     Physical Examination: Vitals:   01/27/23 1105  BP: 130/77    General: Patient is in no apparent distress. Attention to examination is appropriate.  Neck:   Supple.  Full range of motion.  Respiratory: Patient is breathing without any difficulty.   NEUROLOGICAL:     Awake,  alert, oriented to person, place, and time.  Speech is clear and fluent.   Cranial Nerves: Pupils equal round and reactive to light.  Facial tone is symmetric.  Facial sensation is symmetric. Shoulder shrug is symmetric. Tongue protrusion is midline.  There is no pronator drift.  Strength: Side Biceps Triceps Deltoid Interossei Grip Wrist Ext. Wrist Flex.  R 5 5 5 5 5 5 5   L 5 5 5 5 5 5 5    Side Iliopsoas Quads Hamstring PF DF EHL  R 5 5 5 5 5 5   L 5 5 5 5 5 5    Reflexes are 1+ and symmetric at the biceps, triceps, brachioradialis, patella and achilles.   Hoffman's is absent.   Bilateral upper and lower extremity sensation is intact  to light touch.    No evidence of dysmetria noted.  Gait is antalgic. SLR + at 60 degrees on R     Medical Decision Making  Imaging: MRI L spine 09/15/2022 FINDINGS:  Modic type II changes through the inferior endplate of L5 and superior endplate of S1 demonstrated by subtle hyperintense signal on T1 and T2, otherwise the bone marrow signal intensity is unremarkable.. The visualized cord is unremarkable and the conus medullaris ends at a normal level.   The vertebral bodies are normally aligned. Multilevel disc space narrowing and disc desiccation most severe at L5-S1.   T12-L1: Mild facet arthrosis. No significant spinal canal or neural foraminal narrowing.   L1-L2: Minimal spinal canal narrowing due to right paracentral disc bulge protrusion. No significant neural foraminal narrowing. There is mild bilateral facet arthropathy.   L2-L3: Minimal spinal canal narrowing due to small disc bulge and bilateral facet arthropathy. No significant neural foraminal narrowing.   L3-L4: Mild bulging disc. No significant spinal canal or neural foraminal narrowing. There is bilateral facet arthropathy.   L4-L5: Minimal spinal canal narrowing due to disc bulge and bilateral facet arthropathy. Small superimposed right foraminal disc protrusion. Mild bilateral neural foraminal narrowing worse on the right.   L5-S1: No significant spinal canal narrowing. There is a right subarticular disc protrusion resulting in effacement of the right lateral recess with likely impingement upon the traversing right S1 nerve root (5:212). Mild right neural foraminal narrowing. Moderate to severe left neural foraminal narrowing due to disc bulge and endplate osteophyte extending into the foraminal zone (3:69).   The paraspinal tissues are within normal limits.   For the purposes of this dictation, the lowest well formed intervertebral disc space is assumed to be the L5-S1 level, and there are presumed to be five lumbar-type  vertebral bodies.   I have personally reviewed the images and agree with the above interpretation.  Assessment and Plan: Erika Roberts is a pleasant 52 y.o. female with apparent right S1 radiculopathy.  She has previously been diagnosed with meralgia paresthetica, but I do not think that can explain her symptoms.  She has significant symptoms below her knee and into her foot.  She also has buttock pain on the right.  Neither of these would be present and meralgia paresthetica.  I think she most likely has a right S1 radiculopathy.  I am sending her for physical therapy as she has not been evaluated by therapist in a couple of years.  Additionally, I will send her for a diagnostic injection of S1 to see if that helps with her pain.  I will see her back in 8 weeks.  I spent a total of 30 minutes in this patient's care  today. This time was spent reviewing pertinent records including imaging studies, obtaining and confirming history, performing a directed evaluation, formulating and discussing my recommendations, and documenting the visit within the medical record.      Thank you for involving me in the care of this patient.      Jovanni Eckhart K. Myer Haff MD, [facility]

## 2023-01-27 ENCOUNTER — Ambulatory Visit: Payer: BC Managed Care – PPO | Admitting: Neurosurgery

## 2023-01-27 ENCOUNTER — Encounter: Payer: Self-pay | Admitting: Neurosurgery

## 2023-01-27 VITALS — BP 130/77 | Ht 68.0 in | Wt 260.8 lb

## 2023-01-27 DIAGNOSIS — M5417 Radiculopathy, lumbosacral region: Secondary | ICD-10-CM

## 2023-01-27 DIAGNOSIS — M5416 Radiculopathy, lumbar region: Secondary | ICD-10-CM

## 2023-01-27 DIAGNOSIS — G8929 Other chronic pain: Secondary | ICD-10-CM

## 2023-01-27 DIAGNOSIS — M5441 Lumbago with sciatica, right side: Secondary | ICD-10-CM

## 2023-02-02 ENCOUNTER — Encounter: Payer: Self-pay | Admitting: Neurosurgery

## 2023-03-12 ENCOUNTER — Ambulatory Visit: Payer: Self-pay

## 2023-03-12 ENCOUNTER — Ambulatory Visit
Admission: EM | Admit: 2023-03-12 | Discharge: 2023-03-12 | Disposition: A | Discharge status: Home / Self Care | Payer: BC Managed Care – PPO | Attending: Family Medicine | Admitting: Family Medicine

## 2023-03-12 ENCOUNTER — Other Ambulatory Visit: Payer: Self-pay

## 2023-03-12 DIAGNOSIS — J019 Acute sinusitis, unspecified: Secondary | ICD-10-CM

## 2023-03-12 MED ORDER — AZITHROMYCIN 250 MG PO TABS: ORAL_TABLET | ORAL | 0 refills | Status: DC

## 2023-03-12 MED ORDER — PROMETHAZINE-DM 6.25-15 MG/5ML PO SYRP: 5.0000 mL | ORAL_SOLUTION | Freq: Four times a day (QID) | ORAL | 0 refills | Status: DC | PRN

## 2023-03-12 MED ORDER — PREDNISONE 10 MG (21) PO TBPK: ORAL_TABLET | Freq: Every day | ORAL | 0 refills | Status: DC

## 2023-03-12 NOTE — Discharge Instructions (Signed)
Stop by the pharmacy to pick up your prescriptions.  Follow up with your primary care provider as needed.  

## 2023-03-12 NOTE — ED Provider Notes (Signed)
[facility]    CSN: [id number] Arrival date & time: 03/12/23  1150      History   Chief Complaint Chief Complaint  Patient presents with   Otalgia   head congestion    HPI Erika Roberts is a 52 y.o. female.   HPI  History obtained from {source of history:310783}. Erika Roberts presents for headahce, congestion, left ear pain and nasal congestion on the left. Last Tuesday and Wednesday she woke up in a puddle of sweat. She thought she was getting better last weekend but things just got worse. Nothing like this before and only has had 3 migraines in her life.    Fever : no  Chills: no Sore throat: no   Cough: no Sputum: no Chest tightness: no Shortness of breath: no Wheezing: no  Nasal congestion : no  Rhinorrhea: no Myalgias: no Appetite: normal  Hydration: normal  Abdominal pain: no Nausea: no Vomiting: no Diarrhea: No Rash: No Sleep disturbance: no Headache: no      Past Medical History:  Diagnosis Date   Anxiety    Chronic back pain    Depression     There are no problems to display for this patient.   Past Surgical History:  Procedure Laterality Date   BREAST REDUCTION SURGERY      OB History   No obstetric history on file.      Home Medications    Prior to Admission medications   Medication Sig Start Date End Date Taking? Authorizing Provider  cholestyramine light (PREVALITE) 4 g packet Take 1 packet by mouth 2 (two) times daily. 03/01/23  Yes [provider]  clonazePAM (KLONOPIN) 0.5 MG tablet TK 1 T PO BID PRA 05/03/18  Yes [provider]  escitalopram (LEXAPRO) 10 MG tablet 20 mg. TAKING 20 MG TOTAL 04/21/18  Yes [provider]  meloxicam (MOBIC) 7.5 MG tablet Take 7.5 mg by mouth daily. 10/02/20  Yes [provider]  metaxalone (SKELAXIN) 800 MG tablet Take 1 tablet by mouth 2 (two) times daily as needed for pain. 09/22/20  Yes [provider]  pregabalin (LYRICA) 75 MG capsule Take by  mouth. 06/08/22 06/08/23 Yes [provider]  tiZANidine (ZANAFLEX) 4 MG capsule Take 4 mg by mouth 3 (three) times daily.   Yes [provider]    Family History Family History  Problem Relation Age of Onset   Cancer Mother        lung, breast, brain   Diabetes Father    Heart attack Father     Social History Social History   Tobacco Use   Smoking status: Never   Smokeless tobacco: Never  Vaping Use   Vaping Use: Never used  Substance Use Topics   Alcohol use: Yes    Comment: occasionally   Drug use: Not Currently     Allergies   Penicillin g, Gabapentin, and Bee venom   Review of Systems Review of Systems: negative unless otherwise stated in HPI.      Physical Exam Triage Vital Signs ED Triage Vitals  Enc Vitals Group     BP 03/12/23 1158 115/86     Pulse Rate 03/12/23 1158 98     Resp 03/12/23 1158 17     Temp 03/12/23 1158 98.6 F (37 C)     Temp Source 03/12/23 1158 Oral     SpO2 03/12/23 1158 98 %     Weight 03/12/23 1158 261 lb (118.4 kg)  Height 03/12/23 1158 5\' 8"  (1.727 m)     Head Circumference --      Peak Flow --      Pain Score 03/12/23 1156 7     Pain Loc --      Pain Edu? --      Excl. in [place]? --    No data found.  Updated Vital Signs BP 115/86 (BP Location: Left Arm)   Pulse 98   Temp 98.6 F (37 C) (Oral)   Resp 17   Ht 5\' 8"  (1.727 m)   Wt 118.4 kg   SpO2 98%   BMI 39.68 kg/m   Visual Acuity Right Eye Distance:   Left Eye Distance:   Bilateral Distance:    Right Eye Near:   Left Eye Near:    Bilateral Near:     Physical Exam GEN:     alert, non-toxic appearing female in no distress ***   HENT:  mucus membranes moist, oropharyngeal ***without lesions or ***erythema, no*** tonsillar hypertrophy or exudates, *** moderate erythematous edematous turbinates, ***clear nasal discharge, ***bilateral TM normal EYES:   pupils equal and reactive, ***no scleral injection or discharge NECK:  normal ROM, no  ***lymphadenopathy, ***no meningismus   RESP:  no increased work of breathing, ***clear to auscultation bilaterally CVS:   regular rate ***and rhythm Skin:   warm and dry, no rash on visible skin***    UC Treatments / Results  Labs (all labs ordered are listed, but only abnormal results are displayed) Labs Reviewed - No data to display  EKG   Radiology No results found.  Procedures Procedures (including critical care time)  Medications Ordered in UC Medications - No data to display  Initial Impression / Assessment and Plan / UC Course  I have reviewed the triage vital signs and the nursing notes.  Pertinent labs & imaging results that were available during my care of the patient were reviewed by me and considered in my medical decision making (see chart for details).       Pt is a 52 y.o. female who presents for *** days of respiratory symptoms. Erika Roberts is ***afebrile here without recent antipyretics. Satting well on room air. Overall pt is ***non-toxic appearing, well hydrated, without respiratory distress. Pulmonary exam ***is unremarkable.  COVID testing obtained ***and was negative. ***Pt to quarantine until COVID test results or longer if positive.  I will call patient with test results, if positive. History consistent with ***viral respiratory illness. Discussed symptomatic treatment.  Explained lack of efficacy of antibiotics in viral disease.  Typical duration of symptoms discussed.   Return and ED precautions given and voiced understanding. Discussed MDM, treatment plan and plan for follow-up with patient*** who agrees with plan.     Final Clinical Impressions(s) / UC Diagnoses   Final diagnoses:  None   Discharge Instructions   None    ED Prescriptions   None    PDMP not reviewed this encounter.

## 2023-03-12 NOTE — ED Triage Notes (Signed)
Sx started Tuesday. Pain in left ear, head congestion, left head pain.

## 2023-03-24 ENCOUNTER — Encounter: Payer: Self-pay | Admitting: Neurosurgery

## 2023-03-24 ENCOUNTER — Ambulatory Visit: Payer: BC Managed Care – PPO | Admitting: Neurosurgery

## 2023-03-24 VITALS — BP 130/78 | Ht 68.0 in | Wt 261.0 lb

## 2023-03-24 DIAGNOSIS — M5417 Radiculopathy, lumbosacral region: Secondary | ICD-10-CM | POA: Diagnosis not present

## 2023-03-24 DIAGNOSIS — M5416 Radiculopathy, lumbar region: Secondary | ICD-10-CM

## 2023-03-24 NOTE — Progress Notes (Signed)
Referring Physician:  Lanice Shirts, MD [address] [place],  [place] [address]  Primary Physician:  Lanice Shirts, MD  History of Present Illness: 03/24/2023 Erika Roberts returns for reevaluation.  She continues to have right leg pain.  She had an injection 6 weeks ago which helped but her pain is returned.  PT did not help her.  She continues to have pain down her right leg.  01/27/2023 Erika Roberts is here today with a chief complaint of right leg pain.  She has been having pain for several years.  She did physical therapy 2 years ago without improvement.  She has sharp and stabbing pain that goes down her right leg into her hip, posterior lateral thigh, posterior and lateral calf, and on the lateral aspect of her right foot.  She has some numbness in her leg as well in a similar distribution.  This is made worse by walking and standing.  Sitting sometimes makes it worse.  Nothing has really helped.  She has undergone multiple injections for this without satisfactory results.   Bowel/Bladder Dysfunction: none  Conservative measures:  Physical therapy:  has participated in [facility] 2 years ago  Multimodal medical therapy including regular antiinflammatories:  meloxicam, metaxalone, lyrica, tizanidine, oxycodone Injections:  has received epidural steroid injections 09/2022 last only 4 days   Past Surgery: denies  Erika Roberts has no symptoms of cervical myelopathy.  The symptoms are causing a significant impact on the patient's life.   I have utilized the care everywhere function in epic to review the outside records available from external health systems.  Review of Systems:  A 10 point review of systems is negative, except for the pertinent positives and negatives detailed in the HPI.  Past Medical History: Past Medical History:  Diagnosis Date   Anxiety    Chronic back pain    Depression     Past Surgical History: Past Surgical History:  Procedure  Laterality Date   BREAST REDUCTION SURGERY      Allergies: Allergies as of 03/24/2023 - Review Complete 03/24/2023  Allergen Reaction Noted   Penicillin g Hives 05/17/2018   Gabapentin Other (See Comments) and Swelling 12/04/2021   Bee venom  06/25/2016    Medications:  Current Outpatient Medications:    clonazePAM (KLONOPIN) 0.5 MG tablet, TK 1 T PO BID PRA, Disp: , Rfl: 1   escitalopram (LEXAPRO) 10 MG tablet, 20 mg. TAKING 20 MG TOTAL, Disp: , Rfl: 3   meloxicam (MOBIC) 7.5 MG tablet, Take 7.5 mg by mouth daily., Disp: , Rfl:    metaxalone (SKELAXIN) 800 MG tablet, Take 1 tablet by mouth 2 (two) times daily as needed for pain., Disp: , Rfl:    pregabalin (LYRICA) 75 MG capsule, Take by mouth., Disp: , Rfl:    tiZANidine (ZANAFLEX) 4 MG capsule, Take 4 mg by mouth 3 (three) times daily., Disp: , Rfl:   Social History: Social History   Tobacco Use   Smoking status: Never   Smokeless tobacco: Never  Vaping Use   Vaping status: Never Used  Substance Use Topics   Alcohol use: Yes    Comment: occasionally   Drug use: Not Currently    Family Medical History: Family History  Problem Relation Age of Onset   Cancer Mother        lung, breast, brain   Diabetes Father    Heart attack Father     Physical Examination: Vitals:  03/24/23 1138  BP: 130/78    General: Patient is in no apparent distress. Attention to examination is appropriate.  Neck:   Supple.  Full range of motion.  Respiratory: Patient is breathing without any difficulty.   NEUROLOGICAL:     Awake, alert, oriented to person, place, and time.  Speech is clear and fluent.   Cranial Nerves: Pupils equal round and reactive to light.  Facial tone is symmetric.  Facial sensation is symmetric. Shoulder shrug is symmetric. Tongue protrusion is midline.  There is no pronator drift.  Strength: Side Biceps Triceps Deltoid Interossei Grip Wrist Ext. Wrist Flex.  R 5 5 5 5 5 5 5   L 5 5 5 5 5 5 5    Side  Iliopsoas Quads Hamstring PF DF EHL  R 5 5 5 5 5 5   L 5 5 5 5 5 5    Reflexes are 1+ and symmetric at the biceps, triceps, brachioradialis, patella and achilles.   Hoffman's is absent.   Bilateral upper and lower extremity sensation is intact to light touch.    No evidence of dysmetria noted.  Gait is antalgic. SLR + at 60 degrees on R     Medical Decision Making  Imaging: MRI L spine 09/15/2022 FINDINGS:  Modic type II changes through the inferior endplate of L5 and superior endplate of S1 demonstrated by subtle hyperintense signal on T1 and T2, otherwise the bone marrow signal intensity is unremarkable.. The visualized cord is unremarkable and the conus medullaris ends at a normal level.   The vertebral bodies are normally aligned. Multilevel disc space narrowing and disc desiccation most severe at L5-S1.   T12-L1: Mild facet arthrosis. No significant spinal canal or neural foraminal narrowing.   L1-L2: Minimal spinal canal narrowing due to right paracentral disc bulge protrusion. No significant neural foraminal narrowing. There is mild bilateral facet arthropathy.   L2-L3: Minimal spinal canal narrowing due to small disc bulge and bilateral facet arthropathy. No significant neural foraminal narrowing.   L3-L4: Mild bulging disc. No significant spinal canal or neural foraminal narrowing. There is bilateral facet arthropathy.   L4-L5: Minimal spinal canal narrowing due to disc bulge and bilateral facet arthropathy. Small superimposed right foraminal disc protrusion. Mild bilateral neural foraminal narrowing worse on the right.   L5-S1: No significant spinal canal narrowing. There is a right subarticular disc protrusion resulting in effacement of the right lateral recess with likely impingement upon the traversing right S1 nerve root (5:212). Mild right neural foraminal narrowing. Moderate to severe left neural foraminal narrowing due to disc bulge and endplate osteophyte extending into  the foraminal zone (3:69).   The paraspinal tissues are within normal limits.   For the purposes of this dictation, the lowest well formed intervertebral disc space is assumed to be the L5-S1 level, and there are presumed to be five lumbar-type vertebral bodies.   I have personally reviewed the images and agree with the above interpretation.  Assessment and Plan: Erika Roberts is a pleasant 52 y.o. female with apparent right S1 radiculopathy.  Her pain is returned after her injection.  At this point, she has maximized conservative management.  There is no further conservative management indicated at this time.  I recommended a right-sided L5-S1 microdiscectomy.  Her imaging is just over 26 months old.  2 make sure that her disc herniation has not resolved, I have recommended repeating her MRI scan.  We will plan for her microdiscectomy in the interim.    I discussed  the planned procedure at length with the patient, including the risks, benefits, alternatives, and indications. The risks discussed include but are not limited to bleeding, infection, need for reoperation, spinal fluid leak, stroke, vision loss, anesthetic complication, coma, paralysis, and even death. I also described in detail that improvement was not guaranteed.  The patient expressed understanding of these risks, and asked that we proceed with surgery. I described the surgery in layman's terms, and gave ample opportunity for questions, which were answered to the best of my ability.   Thank you for involving me in the care of this patient.      Erika Roberts K. Myer Haff MD, [facility]

## 2023-03-24 NOTE — Patient Instructions (Signed)
Please see below for information in regards to your upcoming surgery:   Planned surgery: Right L5-S1 microdiscectomy   Surgery date: 05/06/23 at [facility] - you will find out your arrival time the business [place] before your surgery.   Pre-op appointment at [facility] Pre-admit Testing: we will call you with a date/time for this. Pre-admit testing is located on the first floor of the [facility] building, [address] [facility] [address], [address]. Please bring all prescriptions in the original prescription bottles to your appointment, even if you have reviewed medications by phone with a pharmacy representative. During this appointment, they will advise you which medications you can take the morning of surgery, and which medications you will need to hold for surgery. Pre-op labs may be done at your pre-op appointment. You are not required to fast for these labs. Should you need to change your pre-op appointment, please call Pre-admit testing at [phone].    Surgical clearance: we will send a clearance form to Dr Margarette Asal    Common restrictions after surgery: No bending, lifting, or twisting ("BLT"). Avoid lifting objects heavier than 10 pounds for the first 6 weeks after surgery. Where possible, avoid household activities that involve lifting, bending, reaching, pushing, or pulling such as laundry, vacuuming, grocery shopping, and childcare. Try to arrange for help from friends and family for these activities while your back heals. Do not drive while taking prescription pain medication. Weeks 6 through 12 after surgery: avoid lifting more than 25 pounds.     How to contact us:  If you have any questions/concerns before or after surgery, you can reach [place] at [phone], or you can send a mychart message. We can be reached by phone or mychart 8am-4pm, Monday-Friday.  *Please note: Calls after 4pm are forwarded to a third party answering service. Mychart messages are not  routinely monitored during evenings, weekends, and holidays. Please call our office to contact the answering service for urgent concerns during non-business hours.    If you have FMLA/disability paperwork, please drop it off or fax it to [phone], attention Patty.   Appointments/FMLA & disability paperwork: Patty & Cristin  Nurse: Royston Cowper  Medical assistants: Laurann Montana Physician Assistant's: Manning Charity & Drake Leach Surgeons: Venetia Night, MD & Ernestine Mcmurray, MD

## 2023-04-03 ENCOUNTER — Ambulatory Visit
Admission: RE | Admit: 2023-04-03 | Discharge: 2023-04-03 | Disposition: A | Discharge status: Home / Self Care | Payer: BC Managed Care – PPO | Source: Ambulatory Visit | Attending: Neurosurgery | Admitting: Neurosurgery

## 2023-04-03 DIAGNOSIS — M5416 Radiculopathy, lumbar region: Secondary | ICD-10-CM | POA: Diagnosis present

## 2023-04-05 ENCOUNTER — Other Ambulatory Visit: Payer: Self-pay

## 2023-04-05 DIAGNOSIS — Z01818 Encounter for other preprocedural examination: Secondary | ICD-10-CM

## 2023-04-11 DIAGNOSIS — I83893 Varicose veins of bilateral lower extremities with other complications: Secondary | ICD-10-CM | POA: Insufficient documentation

## 2023-04-11 DIAGNOSIS — R600 Localized edema: Secondary | ICD-10-CM | POA: Insufficient documentation

## 2023-04-25 ENCOUNTER — Inpatient Hospital Stay: Admission: RE | Admit: 2023-04-25 | Payer: BC Managed Care – PPO | Source: Ambulatory Visit

## 2023-05-02 ENCOUNTER — Other Ambulatory Visit: Payer: Self-pay

## 2023-05-02 ENCOUNTER — Encounter
Admission: RE | Admit: 2023-05-02 | Discharge: 2023-05-02 | Disposition: A | Discharge status: Home / Self Care | Payer: BC Managed Care – PPO | Source: Ambulatory Visit | Attending: Neurosurgery | Admitting: Neurosurgery

## 2023-05-02 DIAGNOSIS — Z01812 Encounter for preprocedural laboratory examination: Secondary | ICD-10-CM | POA: Diagnosis not present

## 2023-05-02 DIAGNOSIS — Z01818 Encounter for other preprocedural examination: Secondary | ICD-10-CM | POA: Diagnosis present

## 2023-05-02 HISTORY — DX: COVID-19: U07.1

## 2023-05-02 HISTORY — DX: Personal history of urinary calculi: Z87.442

## 2023-05-02 LAB — CBC
HCT: 32.7 % — ABNORMAL LOW (ref 36.0–46.0)
Hemoglobin: 11.2 g/dL — ABNORMAL LOW (ref 12.0–15.0)
MCH: 30.4 pg (ref 26.0–34.0)
MCHC: 34.3 g/dL (ref 30.0–36.0)
MCV: 88.6 fL (ref 80.0–100.0)
Platelets: 253 10*3/uL (ref 150–400)
RBC: 3.69 MIL/uL — ABNORMAL LOW (ref 3.87–5.11)
RDW: 13.1 % (ref 11.5–15.5)
WBC: 5.7 10*3/uL (ref 4.0–10.5)
nRBC: 0 % (ref 0.0–0.2)

## 2023-05-02 LAB — URINALYSIS, ROUTINE W REFLEX MICROSCOPIC
Bacteria, UA: NONE SEEN
Bilirubin Urine: NEGATIVE
Glucose, UA: NEGATIVE mg/dL
Ketones, ur: NEGATIVE mg/dL
Nitrite: NEGATIVE
Protein, ur: NEGATIVE mg/dL
Specific Gravity, Urine: 1.006 (ref 1.005–1.030)
pH: 6 (ref 5.0–8.0)

## 2023-05-02 LAB — BASIC METABOLIC PANEL
Anion gap: 8 (ref 5–15)
BUN: 18 mg/dL (ref 6–20)
CO2: 25 mmol/L (ref 22–32)
Calcium: 8.6 mg/dL — ABNORMAL LOW (ref 8.9–10.3)
Chloride: 104 mmol/L (ref 98–111)
Creatinine, Ser: 0.67 mg/dL (ref 0.44–1.00)
GFR, Estimated: >60 mL/min (ref >60)
Glucose, Bld: 112 mg/dL — ABNORMAL HIGH (ref 70–99)
Potassium: 3.5 mmol/L (ref 3.5–5.1)
Sodium: 137 mmol/L (ref 135–145)

## 2023-05-02 LAB — TYPE AND SCREEN
ABO/RH(D): O POS
Antibody Screen: NEGATIVE

## 2023-05-02 LAB — SURGICAL PCR SCREEN
MRSA, PCR: NEGATIVE
Staphylococcus aureus: NEGATIVE

## 2023-05-02 NOTE — Patient Instructions (Signed)
Your procedure is scheduled on: Friday 05/06/23 To find out your arrival time, please call [phone] between 1PM - 3PM on:   Thursday 05/05/23 Report to the Registration Desk on the 1st floor of the [facility]. FREE Valet parking is available.  If your arrival time is 6:00 am, do not arrive before that time as the [facility] entrance doors do not open until 6:00 am.  REMEMBER: Instructions that are not followed completely may result in serious medical risk, up to and including death; or upon the discretion of your surgeon and anesthesiologist your surgery may need to be rescheduled.  Do not eat food after midnight the night before surgery.  No gum chewing or hard candies.  You may however, drink CLEAR liquids up to 2 hours before you are scheduled to arrive for your surgery. Do not drink anything within 2 hours of your scheduled arrival time.  Clear liquids include: - water  - apple juice without pulp - gatorade (not RED colors) - black coffee or tea (Do NOT add milk or creamers to the coffee or tea) Do NOT drink anything that is not on this list.  Type 1 and Type 2 diabetics should only drink water.  One week prior to surgery: Stop Anti-inflammatories (NSAIDS) such as Advil, Aleve, Ibuprofen, Motrin, Naproxen, Naprosyn and Aspirin based products such as Excedrin, Goody's Powder, BC Powder. You can continue your Meloxicam You may however, continue to take Tylenol if needed for pain up until the [place] of surgery.  Stop ANY OVER THE COUNTER supplements and vitamins until after surgery.  Continue taking all prescription medications.   TAKE ONLY THESE MEDICATIONS THE MORNING OF SURGERY WITH A SIP OF WATER:  escitalopram (LEXAPRO) 20 MG tablet  pregabalin (LYRICA) 25 MG capsule   No Alcohol for 24 hours before or after surgery.  No Smoking including e-cigarettes for 24 hours before surgery.  No chewable tobacco products for at least 6 hours before surgery.  No nicotine  patches on the [place] of surgery.  Do not use any "recreational" drugs for at least a week (preferably 2 weeks) before your surgery.  Please be advised that the combination of cocaine and anesthesia may have negative outcomes, up to and including death. If you test positive for cocaine, your surgery will be cancelled.  On the morning of surgery brush your teeth with toothpaste and water, you may rinse your mouth with mouthwash if you wish. Do not swallow any toothpaste or mouthwash.  Use CHG Soap or wipes as directed on instruction sheet. Shower daily for 5 days with CHG starting today Monday 05/02/23   Do not wear lotions, powders, or perfumes.   Do not shave body hair from the neck down 48 hours before surgery.  Wear comfortable clothing (specific to your surgery type) to the hospital.  Do not wear jewelry, make-up, hairpins, clips or nail polish.  Contact lenses, hearing aids and dentures may not be worn into surgery.  Do not bring valuables to the hospital. [facility] is not responsible for any missing/lost belongings or valuables.   Notify your doctor if there is any change in your medical condition (cold, fever, infection).  If you are being discharged the [place] of surgery, you will not be allowed to drive home. You will need a responsible individual to drive you home and stay with you for 24 hours after surgery.   If you are taking public transportation, you will need to have a responsible individual with you.  If you are being admitted to the hospital overnight, leave your suitcase in the car. After surgery it may be brought to your room.  In case of increased patient census, it may be necessary for you, the patient, to continue your postoperative care in the [facility] Near Surgery department.  After surgery, you can help prevent lung complications by doing breathing exercises.  Take deep breaths and cough every 1-2 hours. Your doctor may order a device called an Incentive Spirometer  to help you take deep breaths. When coughing or sneezing, hold a pillow firmly against your incision with both hands. This is called "splinting." Doing this helps protect your incision. It also decreases belly discomfort.  Surgery Visitation Policy:  Patients undergoing a surgery or procedure may have two family members or support persons with them as long as the person is not COVID-19 positive or experiencing its symptoms.   Inpatient Visitation:    Visiting hours are 7 a.m. to 8 p.m. Up to four visitors are allowed at one time in a patient room. The visitors may rotate out with other people during the [place]. One designated support person (adult) may remain overnight.  Please call the Pre-admissions Testing Dept. at [phone] if you have any questions about these instructions.    Pre-operative 5 CHG Bath Instructions   You can play a key role in reducing the risk of infection after surgery. Your skin needs to be as free of germs as possible. You can reduce the number of germs on your skin by washing with CHG (chlorhexidine gluconate) soap before surgery. CHG is an antiseptic soap that kills germs and continues to kill germs even after washing.   DO NOT use if you have an allergy to chlorhexidine/CHG or antibacterial soaps. If your skin becomes reddened or irritated, stop using the CHG and notify one of our RNs at [phone].   Please shower with the CHG soap starting 4 days before surgery using the following schedule:     Please keep in mind the following:  DO NOT shave, including legs and underarms, starting the [place] of your first shower.   You may shave your face at any point before/[place] of surgery.  Place clean sheets on your bed the [place] you start using CHG soap. Use a clean washcloth (not used since being washed) for each shower. DO NOT sleep with pets once you start using the CHG.   CHG Shower Instructions:  If you choose to wash your hair and private area, wash first with  your normal shampoo/soap.  After you use shampoo/soap, rinse your hair and body thoroughly to remove shampoo/soap residue.  Turn the water OFF and apply about 3 tablespoons (45 ml) of CHG soap to a CLEAN washcloth.  Apply CHG soap ONLY FROM YOUR NECK DOWN TO YOUR TOES (washing for 3-5 minutes)  DO NOT use CHG soap on face, private areas, open wounds, or sores.  Pay special attention to the area where your surgery is being performed.  If you are having back surgery, having someone wash your back for you may be helpful. Wait 2 minutes after CHG soap is applied, then you may rinse off the CHG soap.  Pat dry with a clean towel  Put on clean clothes/pajamas   If you choose to wear lotion, please use ONLY the CHG-compatible lotions on the back of this paper.     Additional instructions for the [place] of surgery: DO NOT APPLY any lotions, deodorants, cologne, or perfumes.  Put on clean/comfortable clothes.  Brush your teeth.  Ask your nurse before applying any prescription medications to the skin.      CHG Compatible Lotions   Aveeno Moisturizing lotion  Cetaphil Moisturizing Cream  Cetaphil Moisturizing Lotion  Clairol Herbal Essence Moisturizing Lotion, Dry Skin  Clairol Herbal Essence Moisturizing Lotion, Extra Dry Skin  Clairol Herbal Essence Moisturizing Lotion, Normal Skin  Curel Age Defying Therapeutic Moisturizing Lotion with Alpha Hydroxy  Curel Extreme Care Body Lotion  Curel Soothing Hands Moisturizing Hand Lotion  Curel Therapeutic Moisturizing Cream, Fragrance-Free  Curel Therapeutic Moisturizing Lotion, Fragrance-Free  Curel Therapeutic Moisturizing Lotion, Original Formula  Eucerin Daily Replenishing Lotion  Eucerin Dry Skin Therapy Plus Alpha Hydroxy Crme  Eucerin Dry Skin Therapy Plus Alpha Hydroxy Lotion  Eucerin Original Crme  Eucerin Original Lotion  Eucerin Plus Crme Eucerin Plus Lotion  Eucerin TriLipid Replenishing Lotion  Keri Anti-Bacterial Hand Lotion   Keri Deep Conditioning Original Lotion Dry Skin Formula Softly Scented  Keri Deep Conditioning Original Lotion, Fragrance Free Sensitive Skin Formula  Keri Lotion Fast Absorbing Fragrance Free Sensitive Skin Formula  Keri Lotion Fast Absorbing Softly Scented Dry Skin Formula  Keri Original Lotion  Keri Skin Renewal Lotion Keri Silky Smooth Lotion  Keri Silky Smooth Sensitive Skin Lotion  Nivea Body Creamy Conditioning Oil  Nivea Body Extra Enriched Teacher, adult education Moisturizing Lotion Nivea Crme  Nivea Skin Firming Lotion  NutraDerm 30 Skin Lotion  NutraDerm Skin Lotion  NutraDerm Therapeutic Skin Cream  NutraDerm Therapeutic Skin Lotion  ProShield Protective Hand Cream  Provon moisturizing lotion

## 2023-05-06 ENCOUNTER — Ambulatory Visit: Payer: BC Managed Care – PPO | Admitting: Certified Registered"

## 2023-05-06 ENCOUNTER — Encounter: Payer: Self-pay | Admitting: Neurosurgery

## 2023-05-06 ENCOUNTER — Encounter
Admission: RE | Disposition: A | Discharge status: Home / Self Care | Payer: Self-pay | Source: Home / Self Care | Attending: Neurosurgery

## 2023-05-06 ENCOUNTER — Ambulatory Visit: Payer: BC Managed Care – PPO

## 2023-05-06 ENCOUNTER — Ambulatory Visit
Admission: RE | Admit: 2023-05-06 | Discharge: 2023-05-06 | Disposition: A | Discharge status: Home / Self Care | Payer: BC Managed Care – PPO | Attending: Neurosurgery | Admitting: Neurosurgery

## 2023-05-06 ENCOUNTER — Other Ambulatory Visit: Payer: Self-pay

## 2023-05-06 DIAGNOSIS — Z01818 Encounter for other preprocedural examination: Secondary | ICD-10-CM

## 2023-05-06 DIAGNOSIS — M5416 Radiculopathy, lumbar region: Secondary | ICD-10-CM

## 2023-05-06 DIAGNOSIS — M5117 Intervertebral disc disorders with radiculopathy, lumbosacral region: Secondary | ICD-10-CM | POA: Diagnosis present

## 2023-05-06 HISTORY — PX: LUMBAR LAMINECTOMY/DECOMPRESSION MICRODISCECTOMY: SHX5026

## 2023-05-06 LAB — ABO/RH: ABO/RH(D): O POS

## 2023-05-06 SURGERY — LUMBAR LAMINECTOMY/DECOMPRESSION MICRODISCECTOMY 1 LEVEL
Anesthesia: General | Laterality: Right

## 2023-05-06 MED ORDER — METHYLPREDNISOLONE ACETATE 40 MG/ML IJ SUSP
INTRAMUSCULAR | Status: AC
  Filled 2023-05-06: qty 1

## 2023-05-06 MED ORDER — OXYCODONE HCL 5 MG PO TABS
5.0000 mg | ORAL_TABLET | Freq: Once | ORAL | Status: AC | PRN
  Administered 2023-05-06: 5 mg via ORAL

## 2023-05-06 MED ORDER — PROPOFOL 10 MG/ML IV BOLUS
INTRAVENOUS | Status: AC
  Filled 2023-05-06: qty 20

## 2023-05-06 MED ORDER — PHENYLEPHRINE HCL-NACL 20-0.9 MG/250ML-% IV SOLN
INTRAVENOUS | Status: AC
  Filled 2023-05-06: qty 250

## 2023-05-06 MED ORDER — OXYCODONE HCL 5 MG PO TABS
ORAL_TABLET | ORAL | Status: AC
  Filled 2023-05-06: qty 1

## 2023-05-06 MED ORDER — DROPERIDOL 2.5 MG/ML IJ SOLN: 0.6250 mg | Freq: Once | INTRAMUSCULAR | Status: DC | PRN

## 2023-05-06 MED ORDER — KETAMINE HCL 50 MG/5ML IJ SOSY
PREFILLED_SYRINGE | INTRAMUSCULAR | Status: AC
  Filled 2023-05-06: qty 5

## 2023-05-06 MED ORDER — CEFAZOLIN SODIUM-DEXTROSE 2-4 GM/100ML-% IV SOLN
2.0000 g | Freq: Once | INTRAVENOUS | Status: AC
  Administered 2023-05-06: 2 g via INTRAVENOUS

## 2023-05-06 MED ORDER — MIDAZOLAM HCL 2 MG/2ML IJ SOLN
INTRAMUSCULAR | Status: DC | PRN
  Administered 2023-05-06: 2 mg via INTRAVENOUS

## 2023-05-06 MED ORDER — LIDOCAINE HCL (CARDIAC) PF 100 MG/5ML IV SOSY
PREFILLED_SYRINGE | INTRAVENOUS | Status: DC | PRN
  Administered 2023-05-06: 100 mg via INTRAVENOUS

## 2023-05-06 MED ORDER — SURGIFLO WITH THROMBIN (HEMOSTATIC MATRIX KIT) OPTIME
TOPICAL | Status: DC | PRN
  Administered 2023-05-06: 1 via TOPICAL

## 2023-05-06 MED ORDER — LIDOCAINE HCL (PF) 2 % IJ SOLN
INTRAMUSCULAR | Status: AC
  Filled 2023-05-06: qty 5

## 2023-05-06 MED ORDER — PROMETHAZINE HCL 25 MG/ML IJ SOLN: 6.2500 mg | INTRAMUSCULAR | Status: DC | PRN

## 2023-05-06 MED ORDER — EPINEPHRINE PF 1 MG/ML IJ SOLN
INTRAMUSCULAR | Status: AC
  Filled 2023-05-06: qty 1

## 2023-05-06 MED ORDER — CEFAZOLIN SODIUM-DEXTROSE 2-4 GM/100ML-% IV SOLN
INTRAVENOUS | Status: AC
  Filled 2023-05-06: qty 100

## 2023-05-06 MED ORDER — PROPOFOL 10 MG/ML IV BOLUS
INTRAVENOUS | Status: DC | PRN
Start: 2023-05-06 — End: 2023-05-06
  Administered 2023-05-06: 150 mg via INTRAVENOUS

## 2023-05-06 MED ORDER — BUPIVACAINE-EPINEPHRINE (PF) 0.5% -1:200000 IJ SOLN
INTRAMUSCULAR | Status: DC | PRN
  Administered 2023-05-06: 7.5 mL

## 2023-05-06 MED ORDER — OXYCODONE HCL 5 MG/5ML PO SOLN: 5.0000 mg | Freq: Once | ORAL | Status: AC | PRN

## 2023-05-06 MED ORDER — PHENYLEPHRINE HCL (PRESSORS) 10 MG/ML IV SOLN
INTRAVENOUS | Status: DC | PRN
  Administered 2023-05-06: 80 ug via INTRAVENOUS
  Administered 2023-05-06 (×2): 160 ug via INTRAVENOUS

## 2023-05-06 MED ORDER — PROPOFOL 1000 MG/100ML IV EMUL
INTRAVENOUS | Status: AC
  Filled 2023-05-06: qty 100

## 2023-05-06 MED ORDER — PHENYLEPHRINE HCL-NACL 20-0.9 MG/250ML-% IV SOLN
INTRAVENOUS | Status: DC | PRN
  Administered 2023-05-06: 35 ug/min via INTRAVENOUS

## 2023-05-06 MED ORDER — FAMOTIDINE 20 MG PO TABS
ORAL_TABLET | ORAL | Status: AC
  Filled 2023-05-06: qty 1

## 2023-05-06 MED ORDER — ACETAMINOPHEN 10 MG/ML IV SOLN: 1000.0000 mg | Freq: Once | INTRAVENOUS | Status: DC | PRN

## 2023-05-06 MED ORDER — KETOROLAC TROMETHAMINE 30 MG/ML IJ SOLN
INTRAMUSCULAR | Status: AC
  Filled 2023-05-06: qty 1

## 2023-05-06 MED ORDER — KETAMINE HCL 10 MG/ML IJ SOLN
INTRAMUSCULAR | Status: DC | PRN
  Administered 2023-05-06: 50 mg via INTRAVENOUS

## 2023-05-06 MED ORDER — CHLORHEXIDINE GLUCONATE 0.12 % MT SOLN
15.0000 mL | Freq: Once | OROMUCOSAL | Status: AC
  Administered 2023-05-06: 15 mL via OROMUCOSAL

## 2023-05-06 MED ORDER — PHENYLEPHRINE HCL (PRESSORS) 10 MG/ML IV SOLN
INTRAVENOUS | Status: AC
  Filled 2023-05-06: qty 1

## 2023-05-06 MED ORDER — ONDANSETRON HCL 4 MG/2ML IJ SOLN
INTRAMUSCULAR | Status: AC
  Filled 2023-05-06: qty 2

## 2023-05-06 MED ORDER — SUCCINYLCHOLINE CHLORIDE 200 MG/10ML IV SOSY
PREFILLED_SYRINGE | INTRAVENOUS | Status: DC | PRN
  Administered 2023-05-06: 120 mg via INTRAVENOUS

## 2023-05-06 MED ORDER — ROCURONIUM BROMIDE 10 MG/ML (PF) SYRINGE
PREFILLED_SYRINGE | INTRAVENOUS | Status: AC
  Filled 2023-05-06: qty 10

## 2023-05-06 MED ORDER — MIDAZOLAM HCL 2 MG/2ML IJ SOLN
INTRAMUSCULAR | Status: AC
  Filled 2023-05-06: qty 2

## 2023-05-06 MED ORDER — 0.9 % SODIUM CHLORIDE (POUR BTL) OPTIME
TOPICAL | Status: DC | PRN
  Administered 2023-05-06: 500 mL

## 2023-05-06 MED ORDER — KETOROLAC TROMETHAMINE 30 MG/ML IJ SOLN
INTRAMUSCULAR | Status: DC | PRN
  Administered 2023-05-06: 30 mg via INTRAVENOUS

## 2023-05-06 MED ORDER — FENTANYL CITRATE (PF) 100 MCG/2ML IJ SOLN
INTRAMUSCULAR | Status: AC
  Filled 2023-05-06: qty 2

## 2023-05-06 MED ORDER — METHYLPREDNISOLONE ACETATE 40 MG/ML IJ SUSP
INTRAMUSCULAR | Status: DC | PRN
  Administered 2023-05-06: 40 mg

## 2023-05-06 MED ORDER — FENTANYL CITRATE (PF) 100 MCG/2ML IJ SOLN
25.0000 ug | INTRAMUSCULAR | Status: DC | PRN
  Administered 2023-05-06 (×3): 25 ug via INTRAVENOUS

## 2023-05-06 MED ORDER — LACTATED RINGERS IV SOLN: INTRAVENOUS | Status: DC

## 2023-05-06 MED ORDER — SENNA 8.6 MG PO TABS: 1.0000 | ORAL_TABLET | Freq: Every day | ORAL | 0 refills | Status: DC | PRN

## 2023-05-06 MED ORDER — GLYCOPYRROLATE 0.2 MG/ML IJ SOLN
INTRAMUSCULAR | Status: DC | PRN
  Administered 2023-05-06: .2 mg via INTRAVENOUS

## 2023-05-06 MED ORDER — SODIUM CHLORIDE FLUSH 0.9 % IV SOLN
INTRAVENOUS | Status: AC
  Filled 2023-05-06: qty 20

## 2023-05-06 MED ORDER — SODIUM CHLORIDE (PF) 0.9 % IJ SOLN
INTRAMUSCULAR | Status: DC | PRN
  Administered 2023-05-06: 60 mL

## 2023-05-06 MED ORDER — BUPIVACAINE LIPOSOME 1.3 % IJ SUSP
INTRAMUSCULAR | Status: AC
  Filled 2023-05-06: qty 20

## 2023-05-06 MED ORDER — ORAL CARE MOUTH RINSE: 15.0000 mL | Freq: Once | OROMUCOSAL | Status: AC

## 2023-05-06 MED ORDER — DEXAMETHASONE SODIUM PHOSPHATE 10 MG/ML IJ SOLN
INTRAMUSCULAR | Status: DC | PRN
  Administered 2023-05-06: 10 mg via INTRAVENOUS

## 2023-05-06 MED ORDER — BUPIVACAINE HCL (PF) 0.5 % IJ SOLN
INTRAMUSCULAR | Status: AC
  Filled 2023-05-06: qty 60

## 2023-05-06 MED ORDER — CHLORHEXIDINE GLUCONATE 0.12 % MT SOLN
OROMUCOSAL | Status: AC
  Filled 2023-05-06: qty 15

## 2023-05-06 MED ORDER — GLYCOPYRROLATE 0.2 MG/ML IJ SOLN
INTRAMUSCULAR | Status: AC
  Filled 2023-05-06: qty 1

## 2023-05-06 MED ORDER — DEXAMETHASONE SODIUM PHOSPHATE 10 MG/ML IJ SOLN
INTRAMUSCULAR | Status: AC
  Filled 2023-05-06: qty 1

## 2023-05-06 MED ORDER — SUCCINYLCHOLINE CHLORIDE 200 MG/10ML IV SOSY
PREFILLED_SYRINGE | INTRAVENOUS | Status: AC
  Filled 2023-05-06: qty 10

## 2023-05-06 MED ORDER — FENTANYL CITRATE (PF) 100 MCG/2ML IJ SOLN
INTRAMUSCULAR | Status: DC | PRN
  Administered 2023-05-06 (×2): 50 ug via INTRAVENOUS

## 2023-05-06 MED ORDER — OXYCODONE HCL 5 MG PO TABS: 5.0000 mg | ORAL_TABLET | ORAL | 0 refills | Status: AC | PRN

## 2023-05-06 MED ORDER — FAMOTIDINE 20 MG PO TABS
20.0000 mg | ORAL_TABLET | Freq: Once | ORAL | Status: AC
  Administered 2023-05-06: 20 mg via ORAL

## 2023-05-06 SURGICAL SUPPLY — 40 items
ADH SKN CLS APL DERMABOND .7 (GAUZE/BANDAGES/DRESSINGS) ×1
AGENT HMST KT MTR STRL THRMB (HEMOSTASIS) ×1
BASIN KIT SINGLE STR (MISCELLANEOUS) ×1 IMPLANT
BUR NEURO DRILL SOFT 3.0X3.8M (BURR) ×1 IMPLANT
CNTNR URN SCR LID CUP LEK RST (MISCELLANEOUS) ×1 IMPLANT
CONT SPEC 4OZ STRL OR WHT (MISCELLANEOUS) ×1
DERMABOND ADVANCED .7 DNX12 (GAUZE/BANDAGES/DRESSINGS) ×1 IMPLANT
DRAPE C ARM PK CFD 31 SPINE (DRAPES) ×1 IMPLANT
DRAPE LAPAROTOMY 100X77 ABD (DRAPES) ×1 IMPLANT
DRAPE MICROSCOPE SPINE 48X150 (DRAPES) IMPLANT
DRSG OPSITE POSTOP 3X4 (GAUZE/BANDAGES/DRESSINGS) IMPLANT
ELECT EZSTD 165MM 6.5IN (MISCELLANEOUS) ×1
ELECT REM PT RETURN 9FT ADLT (ELECTROSURGICAL) ×1
ELECTRODE EZSTD 165MM 6.5IN (MISCELLANEOUS) ×1 IMPLANT
ELECTRODE REM PT RTRN 9FT ADLT (ELECTROSURGICAL) ×1 IMPLANT
GLOVE BIOGEL PI IND STRL 6.5 (GLOVE) ×1 IMPLANT
GLOVE SURG SYN 6.5 ES PF (GLOVE) ×1 IMPLANT
GLOVE SURG SYN 6.5 PF PI (GLOVE) ×1 IMPLANT
GLOVE SURG SYN 8.5 E (GLOVE) ×3 IMPLANT
GLOVE SURG SYN 8.5 PF PI (GLOVE) ×3 IMPLANT
GOWN SRG LRG LVL 4 IMPRV REINF (GOWNS) ×1 IMPLANT
GOWN SRG XL LVL 3 NONREINFORCE (GOWNS) ×1 IMPLANT
GOWN STRL NON-REIN TWL XL LVL3 (GOWNS) ×1
GOWN STRL REIN LRG LVL4 (GOWNS) ×1
GRAFT DURAGEN MATRIX 1WX1L (Tissue) IMPLANT
KIT SPINAL PRONEVIEW (KITS) ×1 IMPLANT
MANIFOLD NEPTUNE II (INSTRUMENTS) ×1 IMPLANT
MARKER SKIN DUAL TIP RULER LAB (MISCELLANEOUS) ×1 IMPLANT
NDL SAFETY ECLIP 18X1.5 (MISCELLANEOUS) ×1 IMPLANT
NS IRRIG 1000ML POUR BTL (IV SOLUTION) ×1 IMPLANT
PACK LAMINECTOMY ARMC (PACKS) ×1 IMPLANT
SURGIFLO W/THROMBIN 8M KIT (HEMOSTASIS) ×1 IMPLANT
SUT DVC VLOC 3-0 CL 6 P-12 (SUTURE) ×1 IMPLANT
SUT VIC AB 0 CT1 27 (SUTURE) ×1
SUT VIC AB 0 CT1 27XCR 8 STRN (SUTURE) ×1 IMPLANT
SUT VIC AB 2-0 CT1 18 (SUTURE) ×1 IMPLANT
SYR 30ML LL (SYRINGE) ×2 IMPLANT
SYR 3ML LL SCALE MARK (SYRINGE) ×1 IMPLANT
TRAP FLUID SMOKE EVACUATOR (MISCELLANEOUS) ×1 IMPLANT
WATER STERILE IRR 1000ML POUR (IV SOLUTION) ×2 IMPLANT

## 2023-05-06 NOTE — Op Note (Signed)
Indications: Ms. Erika Roberts is suffering from lumbar radiculopathy. The patient tried and failed conservative management, prompting surgical intervention.  Findings: calcified disc herniation  Preoperative Diagnosis: Lumbar radiculopathy (ICD-10 M54.16) Postoperative Diagnosis: same   EBL: 20 ml IVF: see anesthesia record Drains: none Disposition: Extubated and Stable to PACU Complications: none  No foley catheter was placed.   Preoperative Note:   Risks of surgery discussed include: infection, bleeding, stroke, coma, death, paralysis, CSF leak, nerve/spinal cord injury, numbness, tingling, weakness, complex regional pain syndrome, recurrent stenosis and/or disc herniation, vascular injury, development of instability, neck/back pain, need for further surgery, persistent symptoms, development of deformity, and the risks of anesthesia. The patient understood these risks and agreed to proceed.  Operative Note:   1) Right L5/S1 microdiscectomy  The patient was then brought from the preoperative center with intravenous access established.  The patient underwent general anesthesia and endotracheal tube intubation, and was then rotated on the [place] rail top where all pressure points were appropriately padded.  The skin was then thoroughly cleansed.  Perioperative antibiotic prophylaxis was administered.  Sterile prep and drapes were then applied and a timeout was then observed.  C-arm was brought into the field under sterile conditions, and the L5-S1 disc space identified and marked with an incision on the right 1cm lateral to midline.  Once this was complete a 3 cm incision was opened with the use of a #10 blade knife.  The Metrx tubes were sequentially advanced under lateral fluoroscopy until a 18 x 80 mm Metrx tube was placed over the facet and lamina and secured to the bed.    The microscope was then sterilely brought into the field and muscle creep was hemostased with a bipolar and  resected with a pituitary rongeur.  A Bovie extender was then used to expose the spinous process and lamina.  Careful attention was placed to not violate the facet capsule. A 3 mm matchstick drill bit was then used to make a hemi-laminotomy trough until the ligamentum flavum was exposed.  This was extended to the base of the spinous process.  Once this was complete and the underlying ligamentum flavum was visualized, the ligamentum was dissected with an up angle curette and resected with a #2 and #3 mm biting Kerrison.  The laminotomy opening was also expanded in similar fashion and hemostasis was obtained with Surgifoam and a patty as well as bone wax.  The rostral aspect of the caudal level of the lamina was also resected with a #2 biting Kerrison effort to further enhance exposure.  Once the underlying dura was visualized a Penfield 4 was then used to dissect and expose the traversing nerve root.  Once this was identified a nerve root retractor suction was used to mobilize this medially.  The venous plexus was hemostased with Surgifoam and light bipolar use.  The disc herniation was palpated and noted to be calcified.  It was dissected free and the nerve root and thecal sac were retracted and protected.  The high speed drill was used to remove the calcified disc herniation.   Once the thecal sac and nerve root were noted to be relaxed and under less tension the ball-tipped feeler was passed along the foramen distally to ensure no residual compression was noted.    Depo-Medrol was placed along the nerve root.  The area was irrigated. The tube system was then removed under microscopic visualization and hemostasis was obtained with a bipolar.    The fascial layer was reapproximated  with the use of a 0- Vicryl suture.  Subcutaneous tissue layer was reapproximated using 2-0 Vicryl suture.  3-0 monocryl was used on the skin. The skin was then cleansed and Dermabond was used to close the skin opening.  Patient was  then rotated back to the preoperative bed awakened from anesthesia and taken to recovery all counts are correct in this case.   I performed the entire procedure with the assistance of Manning Charity PA as an Designer, television/film set. An assistant was required for this procedure due to the complexity.  The assistant provided assistance in tissue manipulation and suction, and was required for the successful and safe performance of the procedure. I performed the critical portions of the procedure.   Venetia Night MD

## 2023-05-06 NOTE — Anesthesia Preprocedure Evaluation (Signed)
Anesthesia Evaluation  Patient identified by MRN, date of birth, ID band Patient awake    Reviewed: Allergy & Precautions, H&P , NPO status , Patient's Chart, lab work & pertinent test results, reviewed documented beta blocker date and time   Airway Mallampati: II  TM Distance: >3 FB Neck ROM: full    Dental  (+) Teeth Intact   Pulmonary neg pulmonary ROS   Pulmonary exam normal        Cardiovascular Exercise Tolerance: Good negative cardio ROS Normal cardiovascular exam Rhythm:regular Rate:Normal     Neuro/Psych   Anxiety Depression    negative neurological ROS  negative psych ROS   GI/Hepatic negative GI ROS, Neg liver ROS,,,  Endo/Other  negative endocrine ROS    Renal/GU negative Renal ROS  negative genitourinary   Musculoskeletal   Abdominal   Peds  Hematology negative hematology ROS (+)   Anesthesia Other Findings Past Medical History: No date: Anxiety No date: Chronic back pain No date: COVID No date: Depression No date: History of kidney stones Past Surgical History: No date: BREAST REDUCTION SURGERY No date: COLONOSCOPY No date: CYSTOSCOPY WITH HOLMIUM LASER LITHOTRIPSY; Left     Comment:  x2 No date: WRIST GANGLION EXCISION     Comment:  bilateral wrist BMI    Body Mass Index: 39.53 kg/m     Reproductive/Obstetrics negative OB ROS                             Anesthesia Physical Anesthesia Plan  ASA: 2  Anesthesia Plan: General ETT   Post-op Pain Management:    Induction:   PONV Risk Score and Plan: 4 or greater  Airway Management Planned:   Additional Equipment:   Intra-op Plan:   Post-operative Plan:   Informed Consent: I have reviewed the patients History and Physical, chart, labs and discussed the procedure including the risks, benefits and alternatives for the proposed anesthesia with the patient or authorized representative who has indicated  his/her understanding and acceptance.     Dental Advisory Given  Plan Discussed with: CRNA  Anesthesia Plan Comments:        Anesthesia Quick Evaluation

## 2023-05-06 NOTE — Discharge Instructions (Addendum)
 Your surgeon has performed an operation on your lumbar spine (low back) to relieve pressure on one or more nerves. Many times, patients feel better immediately after surgery and can "overdo it." Even if you feel well, it is important that you follow these activity guidelines. If you do not let your back heal properly from the surgery, you can increase the chance of a disc herniation and/or return of your symptoms. The following are instructions to help in your recovery once you have been discharged from the hospital.  * It is ok to take NSAIDs after surgery.  Activity    No bending, lifting, or twisting ("BLT"). Avoid lifting objects heavier than 10 pounds (gallon milk jug).  Where possible, avoid household activities that involve lifting, bending, pushing, or pulling such as laundry, vacuuming, grocery shopping, and childcare. Try to arrange for help from friends and family for these activities while your back heals.  Increase physical activity slowly as tolerated.  Taking short walks is encouraged, but avoid strenuous exercise. Do not jog, run, bicycle, lift weights, or participate in any other exercises unless specifically allowed by your doctor. Avoid prolonged sitting, including car rides.  Talk to your doctor before resuming sexual activity.  You should not drive until cleared by your doctor.  Until released by your doctor, you should not return to work or school.  You should rest at home and let your body heal.   You may shower three days after your surgery.  After showering, lightly dab your incision dry. Do not take a tub bath or go swimming for 3 weeks, or until approved by your doctor at your follow-up appointment.  If you smoke, we strongly recommend that you quit.  Smoking has been proven to interfere with normal healing in your back and will dramatically reduce the success rate of your surgery. Please contact [facility] (800-QUIT-NOW) and use the resources at www.[facility].com for  assistance in stopping smoking.  Surgical Incision   If you have a dressing on your incision, you may remove it three days after your surgery. Keep your incision area clean and dry.  If you have staples or stitches on your incision, you should have a follow up scheduled for removal. If you do not have staples or stitches, you will have steri-strips (small pieces of surgical tape) or Dermabond glue. The steri-strips/glue should begin to peel away within about a week (it is fine if the steri-strips fall off before then). If the strips are still in place one week after your surgery, you may gently remove them.  Diet            You may return to your usual diet. Be sure to stay hydrated.  When to Contact us  Although your surgery and recovery will likely be uneventful, you may have some residual numbness, aches, and pains in your back and/or legs. This is normal and should improve in the next few weeks.  However, should you experience any of the following, contact us immediately: New numbness or weakness Pain that is progressively getting worse, and is not relieved by your pain medications or rest Bleeding, redness, swelling, pain, or drainage from surgical incision Chills or flu-like symptoms Fever greater than 101.0 F (38.3 C) Problems with bowel or bladder functions Difficulty breathing or shortness of breath Warmth, tenderness, or swelling in your calf  Contact Information How to contact us:  If you have any questions/concerns before or after surgery, you can reach [place] at [phone], or you can  send a [employer]. We can be reached by phone or mychart 8am-4pm, Monday-Friday.  *Please note: Calls after 4pm are forwarded to a third party answering service. Mychart messages are not routinely monitored during evenings, weekends, and holidays. Please call our office to contact the answering service for urgent concerns during non-business hours.   AMBULATORY SURGERY  DISCHARGE  INSTRUCTIONS   The drugs that you were given will stay in your system until tomorrow so for the next 24 hours you should not:  Drive an automobile Make any legal decisions Drink any alcoholic beverage   You may resume regular meals tomorrow.  Today it is better to start with liquids and gradually work up to solid foods.  You may eat anything you prefer, but it is better to start with liquids, then soup and crackers, and gradually work up to solid foods.   Please notify your doctor immediately if you have any unusual bleeding, trouble breathing, redness and pain at the surgery site, drainage, fever, or pain not relieved by medication.    Additional Instructions: PLEASE LEAVE EXPAREL (TEAL) ARMBAND ON FOR 4 DAYS     Please contact your physician with any problems or Same Day Surgery at [phone], Monday through Friday 6 am to 4 pm, or [place] at [facility] number at [phone].

## 2023-05-06 NOTE — Transfer of Care (Signed)
Immediate Anesthesia Transfer of Care Note  Patient: Erika Roberts  Procedure(s) Performed: RIGHT L5-S1 MICRODISCECTOMY (Right)  Patient Location: PACU  Anesthesia Type:General  Level of Consciousness: awake and alert   Airway & Oxygen Therapy: Patient Spontanous Breathing and Patient connected to face mask oxygen  Post-op Assessment: Report given to RN and Post -op Vital signs reviewed and stable  Post vital signs: Reviewed and stable  Last Vitals:  Vitals Value Taken Time  BP 128/66 05/06/23 0909  Temp    Pulse 75 05/06/23 0911  Resp 11 05/06/23 0911  SpO2 100 % 05/06/23 0911  Vitals shown include unfiled device data.  Last Pain:  Vitals:   05/06/23 0616  PainSc: 2          Complications: No notable events documented.

## 2023-05-06 NOTE — Discharge Summary (Signed)
Discharge Summary  Patient ID: Erika Roberts MRN: [id number] DOB/AGE: [birth year] 54 y.o.  Admit date: 05/06/2023 Discharge date: 05/06/2023  Admission Diagnoses: Lumbar radiculopathy Discharge Diagnoses:  Active Problems:   Lumbar radiculopathy   Discharged Condition: good  Hospital Course:  Erika Roberts is a 52 year old female presenting with lumbar radiculopathy and corresponding disc herniation status post right L5-S1 microdiscectomy.  Her intraoperative course was uncomplicated.  She was monitored in PACU and discharged home after ambulating, urinating, and tolerating p.o. intake.  She was given prescriptions for oxycodone and senna.  Consults: None  Significant Diagnostic Studies: none   Treatments: surgery: As above.  Please see separately dictated operative report for further details  Discharge Exam: Blood pressure 131/79, pulse 65, temperature (!) 96.8 F (36 C), resp. rate 12, height 5\' 8"  (1.727 m), weight 117.9 kg, last menstrual period 10/06/2020, SpO2 98%. CN II-XII grossly intact 5/5 throughout BLE  Incision covered with clean post-op bandage  Disposition: Discharge disposition: 01-Home or Self Care        Allergies as of 05/06/2023       Reactions   Bee Venom Anaphylaxis   Penicillin G Hives   Gabapentin Other (See Comments), Swelling   High blood pressure  Peripheral swelling High blood pressure Peripheral swelling        Medication List     TAKE these medications    clonazePAM 0.5 MG tablet Commonly known as: KLONOPIN Take 0.5 mg by mouth 2 (two) times daily as needed for anxiety.   escitalopram 20 MG tablet Commonly known as: LEXAPRO Take 20 mg by mouth daily. What changed: Another medication with the same name was removed. Continue taking this medication, and follow the directions you see here.   meloxicam 7.5 MG tablet Commonly known as: MOBIC Take 7.5 mg by mouth 2 (two) times daily.   metaxalone 800 MG tablet Commonly known as:  SKELAXIN Take 1 tablet by mouth 2 (two) times daily as needed for pain.   oxyCODONE 5 MG immediate release tablet Commonly known as: Roxicodone Take 1 tablet (5 mg total) by mouth every 4 (four) hours as needed for up to 5 days for severe pain.   pregabalin 25 MG capsule Commonly known as: LYRICA Take 25 mg by mouth 2 (two) times daily.   senna 8.6 MG Tabs tablet Commonly known as: SENOKOT Take 1 tablet (8.6 mg total) by mouth daily as needed for mild constipation.   tiZANidine 4 MG capsule Commonly known as: ZANAFLEX Take 4 mg by mouth 3 (three) times daily as needed for muscle spasms.         Signed: Susanne Borders 05/06/2023, 9:20 AM

## 2023-05-06 NOTE — Anesthesia Postprocedure Evaluation (Signed)
Anesthesia Post Note  Patient: Erika Roberts  Procedure(s) Performed: RIGHT L5-S1 MICRODISCECTOMY (Right)  Patient location during evaluation: PACU Anesthesia Type: General Level of consciousness: awake and alert Pain management: pain level controlled Vital Signs Assessment: post-procedure vital signs reviewed and stable Respiratory status: spontaneous breathing, nonlabored ventilation, respiratory function stable and patient connected to nasal cannula oxygen Cardiovascular status: blood pressure returned to baseline and stable Postop Assessment: no apparent nausea or vomiting Anesthetic complications: no   No notable events documented.   Last Vitals:  Vitals:   05/06/23 1021 05/06/23 1050  BP: (!) 143/83 (!) 156/92  Pulse: 74   Resp: 16 16  Temp: 36.4 C   SpO2: 96% 96%    Last Pain:  Vitals:   05/06/23 1021  TempSrc: Tympanic  PainSc:                  Yevette Edwards

## 2023-05-06 NOTE — H&P (Signed)
Referring Physician:  No referring provider defined for this encounter.  Primary Physician:  Lanice Shirts, MD  History of Present Illness: 05/06/2023 Erika Roberts presents today with continued pain.  03/24/2023 Erika Roberts returns for reevaluation.  She continues to have right leg pain.  She had an injection 6 weeks ago which helped but her pain is returned.  PT did not help her.  She continues to have pain down her right leg.  01/27/2023 Erika Roberts is here today with a chief complaint of right leg pain.  She has been having pain for several years.  She did physical therapy 2 years ago without improvement.  She has sharp and stabbing pain that goes down her right leg into her hip, posterior lateral thigh, posterior and lateral calf, and on the lateral aspect of her right foot.  She has some numbness in her leg as well in a similar distribution.  This is made worse by walking and standing.  Sitting sometimes makes it worse.  Nothing has really helped.  She has undergone multiple injections for this without satisfactory results.   Bowel/Bladder Dysfunction: none  Conservative measures:  Physical therapy:  has participated in [facility] 2 years ago  Multimodal medical therapy including regular antiinflammatories:  meloxicam, metaxalone, lyrica, tizanidine, oxycodone Injections:  has received epidural steroid injections 09/2022 last only 4 days   Past Surgery: denies  Erika Roberts has no symptoms of cervical myelopathy.  The symptoms are causing a significant impact on the patient's life.   I have utilized the care everywhere function in epic to review the outside records available from external health systems.  Review of Systems:  A 10 point review of systems is negative, except for the pertinent positives and negatives detailed in the HPI.  Past Medical History: Past Medical History:  Diagnosis Date   Anxiety    Chronic back pain    COVID    Depression    History of kidney  stones     Past Surgical History: Past Surgical History:  Procedure Laterality Date   BREAST REDUCTION SURGERY     COLONOSCOPY     CYSTOSCOPY WITH HOLMIUM LASER LITHOTRIPSY Left    x2   WRIST GANGLION EXCISION     bilateral wrist    Allergies: Allergies as of 04/05/2023 - Review Complete 03/24/2023  Allergen Reaction Noted   Penicillin g Hives 05/17/2018   Gabapentin Other (See Comments) and Swelling 12/04/2021   Bee venom  06/25/2016    Medications:  Current Facility-Administered Medications:    ceFAZolin (ANCEF) IVPB 2g/100 mL premix, 2 g, Intravenous, Once, Venetia Night, MD   lactated ringers infusion, , Intravenous, Continuous, Yevette Edwards, MD, Last Rate: 10 mL/hr at 05/06/23 0639, New Bag at 05/06/23 0639  Social History: Social History   Tobacco Use   Smoking status: Never   Smokeless tobacco: Never  Vaping Use   Vaping status: Never Used  Substance Use Topics   Alcohol use: Yes    Alcohol/week: 6.0 standard drinks of alcohol    Types: 5 Cans of beer, 1 Standard drinks or equivalent per week    Comment: occasionally   Drug use: Never    Family Medical History: Family History  Problem Relation Age of Onset   Cancer Mother        lung, breast, brain   Diabetes Father    Heart attack Father     Physical Examination: Vitals:   05/06/23 0616  BP: (!) 141/83  Pulse: 82  Resp: 17  Temp: (!) 97.1 F (36.2 C)  SpO2: 99%   Heart sounds normal no MRG. Chest Clear to Auscultation Bilaterally.   General: Patient is in no apparent distress. Attention to examination is appropriate.  Neck:   Supple.  Full range of motion.  Respiratory: Patient is breathing without any difficulty.   NEUROLOGICAL:     Awake, alert, oriented to person, place, and time.  Speech is clear and fluent.   Cranial Nerves: Pupils equal round and reactive to light.  Facial tone is symmetric.  Facial sensation is symmetric. Shoulder shrug is symmetric. Tongue protrusion  is midline.  There is no pronator drift.  Strength: Side Biceps Triceps Deltoid Interossei Grip Wrist Ext. Wrist Flex.  R 5 5 5 5 5 5 5   L 5 5 5 5 5 5 5    Side Iliopsoas Quads Hamstring PF DF EHL  R 5 5 5 5 5 5   L 5 5 5 5 5 5    Reflexes are 1+ and symmetric at the biceps, triceps, brachioradialis, patella and achilles.   Hoffman's is absent.   Bilateral upper and lower extremity sensation is intact to light touch.    No evidence of dysmetria noted.    Medical Decision Making  Imaging: MRI L spine 09/15/2022 FINDINGS:  Modic type II changes through the inferior endplate of L5 and superior endplate of S1 demonstrated by subtle hyperintense signal on T1 and T2, otherwise the bone marrow signal intensity is unremarkable.. The visualized cord is unremarkable and the conus medullaris ends at a normal level.   The vertebral bodies are normally aligned. Multilevel disc space narrowing and disc desiccation most severe at L5-S1.   T12-L1: Mild facet arthrosis. No significant spinal canal or neural foraminal narrowing.   L1-L2: Minimal spinal canal narrowing due to right paracentral disc bulge protrusion. No significant neural foraminal narrowing. There is mild bilateral facet arthropathy.   L2-L3: Minimal spinal canal narrowing due to small disc bulge and bilateral facet arthropathy. No significant neural foraminal narrowing.   L3-L4: Mild bulging disc. No significant spinal canal or neural foraminal narrowing. There is bilateral facet arthropathy.   L4-L5: Minimal spinal canal narrowing due to disc bulge and bilateral facet arthropathy. Small superimposed right foraminal disc protrusion. Mild bilateral neural foraminal narrowing worse on the right.   L5-S1: No significant spinal canal narrowing. There is a right subarticular disc protrusion resulting in effacement of the right lateral recess with likely impingement upon the traversing right S1 nerve root (5:212). Mild right neural foraminal  narrowing. Moderate to severe left neural foraminal narrowing due to disc bulge and endplate osteophyte extending into the foraminal zone (3:69).   The paraspinal tissues are within normal limits.   For the purposes of this dictation, the lowest well formed intervertebral disc space is assumed to be the L5-S1 level, and there are presumed to be five lumbar-type vertebral bodies.    MRI L spine 04/03/2023 IMPRESSION: 1. Unchanged lumbar disc and facet degeneration, most notable at L5-S1 where a disc protrusion results in right lateral recess stenosis. 2. Cholelithiasis.     Electronically Signed   By: Sebastian Ache M.D.   On: 04/12/2023 17:33  I have personally reviewed the images and agree with the above interpretation.  Assessment and Plan: Erika Roberts is a pleasant 52 y.o. female with apparent right S1 radiculopathy.  She has failed conservative management. We will proceed with a right-sided L5-S1 microdiscectomy.      [employer]  Janan Halter MD, [facility] Neurosurgery

## 2023-05-06 NOTE — Anesthesia Procedure Notes (Signed)
Procedure Name: Intubation Date/Time: 05/06/2023 7:27 AM  Performed by: Berniece Pap, CRNAPre-anesthesia Checklist: Patient identified, Emergency Drugs available, Suction available and Patient being monitored Patient Re-evaluated:Patient Re-evaluated prior to induction Oxygen Delivery Method: Circle system utilized Preoxygenation: Pre-oxygenation with 100% oxygen Induction Type: IV induction Ventilation: Mask ventilation without difficulty Laryngoscope Size: McGraph and 4 Grade View: Grade I Tube type: Oral Tube size: 7.0 mm Number of attempts: 1 Airway Equipment and Method: Stylet and Oral airway Placement Confirmation: ETT inserted through vocal cords under direct vision, positive ETCO2 and breath sounds checked- equal and bilateral Secured at: 21 cm Tube secured with: Tape Dental Injury: Teeth and Oropharynx as per pre-operative assessment

## 2023-05-18 NOTE — Progress Notes (Signed)
   REFERRING PHYSICIAN:  Lanice Shirts, Md [address] [place],  [place] [address]  DOS: 05/06/23  right L5-S1 microdiscectomy  HISTORY OF PRESENT ILLNESS: Erika Roberts is approximately 2 weeks status post right L5-S1 microdiscectomy.  Was given oxycodone on discharge from the hospital.   She has minimal LBP. Her preop leg pain is mostly gone, but she notes intermittent sharp pain in the leg. She is not taking oxycodone or any muscle relaxers. She is still taking her lyrica.    PHYSICAL EXAMINATION:  General: Patient is well developed, well nourished, calm, collected, and in no apparent distress.   NEUROLOGICAL:  General: In no acute distress.   Awake, alert, oriented to person, place, and time.  Pupils equal round and reactive to light.  Facial tone is symmetric.     Strength:            Side Iliopsoas Quads Hamstring PF DF EHL  R 5 5 5 5 5 5   L 5 5 5 5 5 5    Incision c/d/i   ROS (Neurologic):  Negative except as noted above  IMAGING: Nothing new to review.   ASSESSMENT/PLAN:  Erika Roberts is doing well s/p above surgery. Treatment options reviewed with patient and following plan made:   - I have advised the patient to lift up to 10 pounds until 6 weeks after surgery (follow up with Dr. Myer Haff).  - Reviewed wound care.  - No bending, twisting, or lifting.  - Continue on current medications including lyrica.  - Follow up as scheduled in 4 weeks and prn.   Advised to contact the office if any questions or concerns arise.  Drake Leach PA-C Department of neurosurgery

## 2023-05-20 ENCOUNTER — Encounter: Payer: Self-pay | Admitting: Orthopedic Surgery

## 2023-05-20 ENCOUNTER — Ambulatory Visit (INDEPENDENT_AMBULATORY_CARE_PROVIDER_SITE_OTHER): Payer: BC Managed Care – PPO | Admitting: Orthopedic Surgery

## 2023-05-20 VITALS — BP 122/72 | Ht 68.0 in | Wt 260.0 lb

## 2023-05-20 DIAGNOSIS — Z9889 Other specified postprocedural states: Secondary | ICD-10-CM

## 2023-05-20 DIAGNOSIS — M5416 Radiculopathy, lumbar region: Secondary | ICD-10-CM

## 2023-05-20 DIAGNOSIS — Z09 Encounter for follow-up examination after completed treatment for conditions other than malignant neoplasm: Secondary | ICD-10-CM

## 2023-06-02 ENCOUNTER — Other Ambulatory Visit: Payer: Self-pay | Admitting: *Deleted

## 2023-06-02 DIAGNOSIS — M7989 Other specified soft tissue disorders: Secondary | ICD-10-CM

## 2023-06-09 ENCOUNTER — Ambulatory Visit: Payer: BC Managed Care – PPO | Admitting: Physician Assistant

## 2023-06-09 ENCOUNTER — Ambulatory Visit (HOSPITAL_COMMUNITY)
Admission: RE | Admit: 2023-06-09 | Discharge: 2023-06-09 | Disposition: A | Discharge status: Home / Self Care | Payer: BC Managed Care – PPO | Source: Ambulatory Visit | Attending: Vascular Surgery | Admitting: Vascular Surgery

## 2023-06-09 VITALS — BP 121/71 | HR 85 | Temp 98.2°F | Resp 20 | Ht 68.0 in | Wt 262.0 lb

## 2023-06-09 DIAGNOSIS — M7989 Other specified soft tissue disorders: Secondary | ICD-10-CM

## 2023-06-09 NOTE — Progress Notes (Signed)
VASCULAR & VEIN SPECIALISTS           OF [place]  History and Physical   Erika Roberts is a 52 y.o. female who presents with BLE leg swelling with left > right.   She states that her left leg swells and has been swelling for some time.  She was seen by her PCP and was prescribed lasix for when she was having significant swelling.  She states this really hasn't made a difference.  She has never had any hx of DVT.  She does have family hx of varicose veins on her mother's side.  Her father had leg swelling with ulcerations.  She does not have any skin color changes.  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  She has some superficial veins that are painful.    She recently had back surgery and has not been able to be active.  She has been wearing compression socks that are 20-35mmHg knee high.  She was elevating her legs at night prior to her back surgery but hasn't really been elevating since then as she has not had the pain from her back since surgery and feels 80% better.  She states that she was referred to a bariatric MD and was put on Wegovy and she lost 30 lbs but then insurance stopped paying for it.    The pt is not on a statin for cholesterol management.  The pt is not on a daily aspirin.   Other AC:  none The pt is not on medication for hypertension.   The pt is not on medication for diabetes.   Tobacco hx:  never  Pt does not have family hx of AAA.  Past Medical History:  Diagnosis Date   Anxiety    Chronic back pain    COVID    Depression    History of kidney stones     Past Surgical History:  Procedure Laterality Date   BREAST REDUCTION SURGERY     COLONOSCOPY     CYSTOSCOPY WITH HOLMIUM LASER LITHOTRIPSY Left    x2   LUMBAR LAMINECTOMY/DECOMPRESSION MICRODISCECTOMY Right 05/06/2023   Procedure: RIGHT L5-S1 MICRODISCECTOMY;  Surgeon: Venetia Night, MD;  Location: [facility] ORS;  Service: Neurosurgery;   Laterality: Right;   WRIST GANGLION EXCISION     bilateral wrist    Social History   Socioeconomic History   Marital status: Single    Spouse name: Not on file   Number of children: Not on file   Years of education: Not on file   Highest education level: Not on file  Occupational History   Not on file  Tobacco Use   Smoking status: Never   Smokeless tobacco: Never  Vaping Use   Vaping status: Never Used  Substance and Sexual Activity   Alcohol use: Yes    Alcohol/week: 6.0 standard drinks of alcohol    Types: 5 Cans of beer, 1 Standard drinks or equivalent per week    Comment: occasionally   Drug use: Never   Sexual activity: Not on file  Other Topics Concern   Not on file  Social History Narrative   Not on file   Social Determinants of Health   Financial Resource Strain: Low Risk  (04/11/2023)   Received from [facility]   Overall Financial Resource Strain (CARDIA)    Difficulty of Paying Living Expenses:  Not hard at all  Food Insecurity: No Food Insecurity (04/11/2023)   Received from [facility]   Hunger Vital Sign    Worried About Running Out of Food in the Last Year: Never true    Ran Out of Food in the Last Year: Never true  Transportation Needs: No Transportation Needs (04/11/2023)   Received from [facility] - Transportation    Lack of Transportation (Medical): No    Lack of Transportation (Non-Medical): No  Physical Activity: Not on file  Stress: Not on file  Social Connections: Unknown (02/01/2021)   Received from [facility]   Social Connections    Do your friends and family support you?: Not on file    What agencies support you?: Not on file  Intimate Partner Violence: Not on file     Family History  Problem Relation Age of Onset   Cancer Mother        lung, breast, brain   Diabetes Father    Heart attack Father     Current Outpatient Medications  Medication Sig Dispense  Refill   clonazePAM (KLONOPIN) 0.5 MG tablet Take 0.5 mg by mouth 2 (two) times daily as needed for anxiety.  1   escitalopram (LEXAPRO) 20 MG tablet Take 20 mg by mouth daily.     meloxicam (MOBIC) 7.5 MG tablet Take 7.5 mg by mouth 2 (two) times daily.     metaxalone (SKELAXIN) 800 MG tablet Take 1 tablet by mouth 2 (two) times daily as needed for pain.     pregabalin (LYRICA) 25 MG capsule Take 25 mg by mouth 2 (two) times daily.     senna (SENOKOT) 8.6 MG TABS tablet Take 1 tablet (8.6 mg total) by mouth daily as needed for mild constipation. 30 tablet 0   tiZANidine (ZANAFLEX) 4 MG capsule Take 4 mg by mouth 3 (three) times daily as needed for muscle spasms.     No current facility-administered medications for this visit.    Allergies  Allergen Reactions   Bee Venom Anaphylaxis   Penicillin G Hives   Gabapentin Other (See Comments) and Swelling    High blood pressure  Peripheral swelling  High blood pressure Peripheral swelling    REVIEW OF SYSTEMS:   [X]  denotes positive finding, [ ]  denotes negative finding Cardiac  Comments:  Chest pain or chest pressure:    Shortness of breath upon exertion:    Short of breath when lying flat:    Irregular heart rhythm:        Vascular    Pain in calf, thigh, or hip brought on by ambulation:    Pain in feet at night that wakes you up from your sleep:     Blood clot in your veins:    Leg swelling:  x       Pulmonary    Oxygen at home:    Productive cough:     Wheezing:         Neurologic    Sudden weakness in arms or legs:     Sudden numbness in arms or legs:     Sudden onset of difficulty speaking or slurred speech:    Temporary loss of vision in one eye:     Problems with dizziness:         Gastrointestinal    Blood in stool:     Vomited blood:         Genitourinary  Burning when urinating:     Blood in urine:        Psychiatric    Major depression:         Hematologic    Bleeding problems:    Problems with  blood clotting too easily:        Skin    Rashes or ulcers:        Constitutional    Fever or chills:      PHYSICAL EXAMINATION:  Today's Vitals   06/09/23 1221 06/09/23 1223  BP: 121/71   Pulse: 85   Resp: 20   Temp: 98.2 F (36.8 C)   TempSrc: Temporal   SpO2: 94%   Weight: 262 lb (118.8 kg)   Height: 5\' 8"  (1.727 m)   PainSc: 0-No pain 0-No pain   Body mass index is 39.84 kg/m.   General:  WDWN in NAD; vital signs documented above Gait: Not observed HENT: WNL, normocephalic Pulmonary: normal non-labored breathing without wheezing Cardiac: regular HR; without carotid bruits Skin: without rashes Vascular Exam/Pulses:  Right Left  Radial 2+ (normal) 2+ (normal)  DP 2+ (normal) 2+ (normal)   Extremities: BLE swelling with left > right    left  left  left  right   Neurologic: A&O X 3;  moving all extremities equally Psychiatric:  The pt has Normal affect.   Non-Invasive Vascular Imaging:   Venous duplex on 06/09/2023: +--------------+---------+------+-----------+------------+-------------+  LEFT         Reflux NoRefluxReflux TimeDiameter cmsComments                               Yes                                        +--------------+---------+------+-----------+------------+-------------+  CFV          no                                                   +--------------+---------+------+-----------+------------+-------------+  FV mid        no                                                   +--------------+---------+------+-----------+------------+-------------+  Popliteal    no                                                   +--------------+---------+------+-----------+------------+-------------+  GSV at SFJ              yes    >500 ms      0.50                   +--------------+---------+------+-----------+------------+-------------+  GSV prox thigh          yes    >500 ms      0.61                    +--------------+---------+------+-----------+------------+-------------+  GSV mid thigh no                            0.41                   +--------------+---------+------+-----------+------------+-------------+  GSV dist thighno                            0.48    out of fascia  +--------------+---------+------+-----------+------------+-------------+  GSV at knee                                         NV             +--------------+---------+------+-----------+------------+-------------+  GSV prox calf no                            0.30                   +--------------+---------+------+-----------+------------+-------------+  GSV mid calf  no                            0.33    back in fasia  +--------------+---------+------+-----------+------------+-------------+  SSV Pop Fossa no                            0.69                   +--------------+---------+------+-----------+------------+-------------+  SSV prox calf no                            0.49                   +--------------+---------+------+-----------+------------+-------------+  SSV mid calf  no                            0.37                   +--------------+---------+------+-----------+------------+-------------+  AASV 0                  yes    >500 ms      0.39                   +--------------+---------+------+-----------+------------+-------------+  AASV P        no                            0.40                   +--------------+---------+------+-----------+------------+-------------+   Summary:  Left:  - No evidence of deep vein thrombosis seen in the left lower extremity,  from the common femoral through the popliteal veins.  - Venous reflux is noted in the left sapheno-femoral junction.  - Venous reflux is noted in the left greater saphenous vein in the thigh.  - Reflux at the origin of the AASV.     Erika Roberts is a 52 y.o. female  who presents with: BLE swelling with left > right    -pt has easily  palpable DP pedal pulses bilaterally -pt does not have evidence of DVT.  Pt does not have venous reflux in the deep venous system. She does have venous reflux in the GSV at the [facility] and proximal thigh as well as the AASV. -discussed with pt options of possible laser ablation vs conservative measures mentioned below.  She would like to try recommendations given today.   -we did discuss that given the left leg is worse than the right, there is a possibility of May Thurner.  If she tries conservative measures without relief, we will see her back and possibly send for CT venogram for further evaluation.  Pt is in agreement with this plan. -discussed with pt about wearing knee high 15-20 or 20 - 30 mmHg compression stockings and pt was measured for these today.   She understands that if she wants to consider possible laser ablation, she would need to wear thigh high 20-68mmHg compression x 3 months.  -she does have some superficial veins she would like to discuss sclerotherapy.  Will have Harriett Sine call her with more information.   -discussed the importance of leg elevation and how to elevate properly - pt is advised to elevate their legs and a diagram is given to them to demonstrate for pt to lay flat on their back with knees elevated and slightly bent with their feet higher than their knees, which puts their feet higher than their heart for 15 minutes per Barbero.  If pt cannot lay flat, advised to lay as flat as possible.  -pt is advised to continue as much walking as possible and avoid sitting or standing for long periods of time.  -discussed importance of weight loss and exercise and that water aerobics would also be beneficial.  She will advance her activities as tolerated per her spine surgeon.  -handout with recommendations given -pt will f/u as needed   Doreatha Massed, [facility] Vein Special[facility] [phone]  Clinic MD:  Lenell Antu  on call MD

## 2023-06-10 ENCOUNTER — Encounter: Payer: BC Managed Care – PPO | Admitting: Vascular Surgery

## 2023-06-10 ENCOUNTER — Encounter (HOSPITAL_COMMUNITY): Payer: BC Managed Care – PPO

## 2023-06-13 DIAGNOSIS — R195 Other fecal abnormalities: Secondary | ICD-10-CM | POA: Insufficient documentation

## 2023-06-13 DIAGNOSIS — R152 Fecal urgency: Secondary | ICD-10-CM | POA: Insufficient documentation

## 2023-06-16 ENCOUNTER — Encounter: Payer: Self-pay | Admitting: Neurosurgery

## 2023-06-16 ENCOUNTER — Ambulatory Visit: Payer: BC Managed Care – PPO | Admitting: Neurosurgery

## 2023-06-16 VITALS — BP 116/76 | Temp 97.9°F | Ht 68.0 in | Wt 262.0 lb

## 2023-06-16 DIAGNOSIS — Z09 Encounter for follow-up examination after completed treatment for conditions other than malignant neoplasm: Secondary | ICD-10-CM

## 2023-06-16 DIAGNOSIS — M5416 Radiculopathy, lumbar region: Secondary | ICD-10-CM

## 2023-06-16 NOTE — Addendum Note (Signed)
Addended by: Venetia Night on: 06/16/2023 12:19 PM   Modules accepted: Orders

## 2023-06-16 NOTE — Progress Notes (Signed)
   REFERRING PHYSICIAN:  Lanice Shirts, Md [address] [place],  [place] [address]  DOS: 05/06/23  right L5-S1 microdiscectomy  HISTORY OF PRESENT ILLNESS: Erika Roberts is status post right L5-S1 microdiscectomy.  She is doing very well.  She has minimal pain.  She has had a couple of exacerbations of pain.    PHYSICAL EXAMINATION:  General: Patient is well developed, well nourished, calm, collected, and in no apparent distress.   NEUROLOGICAL:  General: In no acute distress.   Awake, alert, oriented to person, place, and time.  Pupils equal round and reactive to light.  Facial tone is symmetric.     Strength:            Side Iliopsoas Quads Hamstring PF DF EHL  R 5 5 5 5 5 5   L 5 5 5 5 5 5    Incision c/d/i   ROS (Neurologic):  Negative except as noted above  IMAGING: Nothing new to review.   ASSESSMENT/PLAN:  Erika Roberts is doing well s/p above surgery.  We reviewed activity limitations.  We will see her back in approximately 6 weeks.   Venetia Night MD Department of neurosurgery

## 2023-07-22 NOTE — Progress Notes (Unsigned)
   REFERRING PHYSICIAN:  Lanice Shirts, Md [address] [place],  [place] [address]  DOS: 05/06/23  right L5-S1 microdiscectomy  HISTORY OF PRESENT ILLNESS:  She was doing well at her last visit with minimal pain. She has mild intermittent stiffness in her back. Had one episode of pain in right thigh yesterday that resolved. No numbness, tingling, or weakness.   She is taking mobic 7.5 bid prn and skelaxin prn. Not taking lyrica.    PHYSICAL EXAMINATION:  General: Patient is well developed, well nourished, calm, collected, and in no apparent distress.   NEUROLOGICAL:  General: In no acute distress.   Awake, alert, oriented to person, place, and time.  Pupils equal round and reactive to light.  Facial tone is symmetric.     Strength:            Side Iliopsoas Quads Hamstring PF DF EHL  R 5 5 5 5 5 5   L 5 5 5 5 5 5    Incision well healed   ROS (Neurologic):  Negative except as noted above  IMAGING: Nothing new to review.   ASSESSMENT/PLAN:  Erika Roberts is doing well s/p above surgery. Treatment options reviewed with patient and following plan made:   - Can slowly return to activity as tolerated.  She will avoid lifting over 50 pounds at work.  She can self restrict. - New prescription for Mobic 15 mg to take daily as needed. We reviewed dosing and side effects.  Take with food. -She is also on Lexapro and we discussed the slight increased risk of bleeding when taking both Mobic and Lexapro.  She will stop Mobic if she has any GI issues, nausea, vomiting, or blood in her stool. -She can continue on skelaxin prn as well.  She is aware this can make her sleepy. -She will follow-up as needed  Advised to contact the office if any questions or concerns arise.  Drake Leach PA-C Department of neurosurgery

## 2023-07-28 ENCOUNTER — Encounter: Payer: Self-pay | Admitting: Orthopedic Surgery

## 2023-07-28 ENCOUNTER — Ambulatory Visit: Payer: BC Managed Care – PPO | Admitting: Orthopedic Surgery

## 2023-07-28 VITALS — BP 116/74 | Ht 68.0 in | Wt 262.0 lb

## 2023-07-28 DIAGNOSIS — M5416 Radiculopathy, lumbar region: Secondary | ICD-10-CM

## 2023-07-28 DIAGNOSIS — Z9889 Other specified postprocedural states: Secondary | ICD-10-CM

## 2023-07-28 MED ORDER — MELOXICAM 15 MG PO TABS
15.0000 mg | ORAL_TABLET | Freq: Every day | ORAL | 1 refills | Status: AC
Start: 2023-07-28 — End: 2024-07-27

## 2023-08-08 ENCOUNTER — Ambulatory Visit: Payer: BC Managed Care – PPO

## 2023-08-08 ENCOUNTER — Ambulatory Visit
Admission: RE | Admit: 2023-08-08 | Discharge: 2023-08-08 | Disposition: A | Discharge status: Home / Self Care | Payer: BC Managed Care – PPO | Source: Ambulatory Visit | Attending: Internal Medicine | Admitting: Internal Medicine

## 2023-08-08 VITALS — BP 130/79 | HR 82 | Temp 98.6°F | Resp 16 | Ht 68.0 in | Wt 262.0 lb

## 2023-08-08 DIAGNOSIS — M25562 Pain in left knee: Secondary | ICD-10-CM

## 2023-08-08 MED ORDER — NAPROXEN 500 MG PO TABS
500.0000 mg | ORAL_TABLET | Freq: Two times a day (BID) | ORAL | 0 refills | Status: DC | PRN
Start: 2023-08-08 — End: 2024-01-24

## 2023-08-08 NOTE — ED Provider Notes (Signed)
[facility]    CSN: [id number] Arrival date & time: 08/08/23  1547      History   Chief Complaint Chief Complaint  Patient presents with   Knee Pain    Appt    HPI Erika Roberts is a 52 y.o. female presents for knee pain.  Patient reports 2 weeks of a persistent lateral/posterior left knee pain.  Patient denies any known injury or inciting event.  Reports some mild swelling and pain with weightbearing.  No numbness or tingling.  No history of injuries or surgeries to this knee in the past.  She has been using ice as treatment.  No other concerns at this time.   Knee Pain   Past Medical History:  Diagnosis Date   Anxiety    Chronic back pain    COVID    Depression    History of kidney stones     Patient Active Problem List   Diagnosis Date Noted   Lumbar radiculopathy 05/06/2023    Past Surgical History:  Procedure Laterality Date   BREAST REDUCTION SURGERY     COLONOSCOPY     CYSTOSCOPY WITH HOLMIUM LASER LITHOTRIPSY Left    x2   LUMBAR LAMINECTOMY/DECOMPRESSION MICRODISCECTOMY Right 05/06/2023   Procedure: RIGHT L5-S1 MICRODISCECTOMY;  Surgeon: Venetia Night, MD;  Location: [facility] ORS;  Service: Neurosurgery;  Laterality: Right;   WRIST GANGLION EXCISION     bilateral wrist    OB History   No obstetric history on file.      Home Medications    Prior to Admission medications   Medication Sig Start Date End Date Taking? Authorizing Provider  cholestyramine light (PREVALITE) 4 g packet Take 4 g by mouth 2 (two) times daily. 06/27/23  Yes [provider]  clonazePAM (KLONOPIN) 0.5 MG tablet Take 0.5 mg by mouth 2 (two) times daily as needed for anxiety. 05/03/18  Yes [provider]  escitalopram (LEXAPRO) 20 MG tablet Take 20 mg by mouth daily.   Yes [provider]  furosemide (LASIX) 20 MG tablet  06/28/23  Yes [provider]  meloxicam (MOBIC) 15 MG tablet Take 1 tablet (15 mg total) by mouth daily. Take  with food. 07/28/23 07/27/24 Yes Drake Leach, PA-C  metaxalone (SKELAXIN) 800 MG tablet  07/07/23  Yes [provider]  naproxen (NAPROSYN) 500 MG tablet Take 1 tablet (500 mg total) by mouth 2 (two) times daily as needed (knee pain). 08/08/23  Yes Radford Pax, NP  Potassium Chloride ER 20 MEQ TBCR Take 1 tablet by mouth as needed. 06/26/23  Yes [provider]    Family History Family History  Problem Relation Age of Onset   Cancer Mother        lung, breast, brain   Diabetes Father    Heart attack Father     Social History Social History   Tobacco Use   Smoking status: Never   Smokeless tobacco: Never  Vaping Use   Vaping status: Never Used  Substance Use Topics   Alcohol use: Yes    Alcohol/week: 6.0 standard drinks of alcohol    Types: 5 Cans of beer, 1 Standard drinks or equivalent per week    Comment: occasionally   Drug use: Never     Allergies   Bee venom, Penicillin g, and Gabapentin   Review of Systems Review of Systems  Musculoskeletal:        Left knee pain     Physical Exam Triage Vital Signs  ED Triage Vitals  Encounter Vitals Group     BP 08/08/23 1631 130/79     Systolic BP Percentile --      Diastolic BP Percentile --      Pulse Rate 08/08/23 1631 82     Resp 08/08/23 1631 16     Temp 08/08/23 1631 98.6 F (37 C)     Temp Source 08/08/23 1631 Oral     SpO2 08/08/23 1631 95 %     Weight 08/08/23 1630 262 lb (118.8 kg)     Height 08/08/23 1630 5\' 8"  (1.727 m)     Head Circumference --      Peak Flow --      Pain Score 08/08/23 1635 8     Pain Loc --      Pain Education --      Exclude from Growth Chart --    No data found.  Updated Vital Signs BP 130/79 (BP Location: Right Arm)   Pulse 82   Temp 98.6 F (37 C) (Oral)   Resp 16   Ht 5\' 8"  (1.727 m)   Wt 262 lb (118.8 kg)   LMP 10/06/2020 (Approximate)   SpO2 95%   BMI 39.84 kg/m   Visual Acuity Right Eye Distance:   Left Eye Distance:   Bilateral  Distance:    Right Eye Near:   Left Eye Near:    Bilateral Near:     Physical Exam Vitals and nursing note reviewed.  Constitutional:      General: She is not in acute distress.    Appearance: Normal appearance. She is not ill-appearing.  HENT:     Head: Normocephalic and atraumatic.  Eyes:     Pupils: Pupils are equal, round, and reactive to light.  Cardiovascular:     Rate and Rhythm: Normal rate.  Pulmonary:     Effort: Pulmonary effort is normal.  Musculoskeletal:     Left knee: No deformity, effusion, erythema, ecchymosis, lacerations, bony tenderness or crepitus. Normal range of motion. Tenderness present over the lateral joint line. No patellar tendon tenderness. Normal patellar mobility. Normal pulse.     Comments: Strength 5 out of 5 bilateral lower extremities.  Positive valgus and varus stress test.  Skin:    General: Skin is warm and dry.  Neurological:     General: No focal deficit present.     Mental Status: She is alert and oriented to person, place, and time.  Psychiatric:        Mood and Affect: Mood normal.        Behavior: Behavior normal.      UC Treatments / Results  Labs (all labs ordered are listed, but only abnormal results are displayed) Labs Reviewed - No data to display  Basic metabolic panel Order: [id number] Status: Final result     Visible to patient: Yes (seen)     Next appt: None     Dx: Pre-op evaluation   0 Result Notes     Component Ref Range & Units 3 mo ago 10 yr ago  Sodium 135 - 145 mmol/L 137 136 R  Potassium 3.5 - 5.1 mmol/L 3.5 4.2  Chloride 98 - 111 mmol/L 104 105 R  CO2 22 - 32 mmol/L 25 27 R  Glucose, Bld 70 - 99 mg/dL 308 High  90 R  Comment: Glucose reference range applies only to samples taken after fasting for at least 8 hours.  BUN 6 - 20 mg/dL 18 11 R  Creatinine, Ser 0.44 - 1.00 mg/dL 4.09 8.11 R  Calcium 8.9 - 10.3 mg/dL 8.6 Low  8.9 R  GFR, Estimated >60 mL/min >60   Comment: (NOTE) Calculated  using the CKD-EPI Creatinine Equation (2021)  Anion gap 5 - 15 8 4  Low  R  Comment: Performed at [facility], [address] [place], [place] [address]  Resulting Agency [facility] LAB [facility] LAB CONVERSION         Specimen Collected: 05/02/23 09:54 Last Resulted: 05/02/23 10:59         EKG   Radiology No results found.  Procedures Procedures (including critical care time)  Medications Ordered in UC Medications - No data to display  Initial Impression / Assessment and Plan / UC Course  I have reviewed the triage vital signs and the nursing notes.  Pertinent labs & imaging results that were available during my care of the patient were reviewed by me and considered in my medical decision making (see chart for details).     Reviewed exam and symptoms with patient.  No red flags.  X-ray of knee without obvious fracture.  Calcification/bone spurs noted.  Will contact for any positive results based on radiology overread.  Discussed arthritis.  Patient placed in knee brace and discussed RICE therapy.  Naproxen twice daily as needed for pain/inflammation.  Advised PCP follow-up 2 days for recheck.  ER precautions reviewed. Final Clinical Impressions(s) / UC Diagnoses   Final diagnoses:  Acute pain of left knee   Discharge Instructions   None    ED Prescriptions     Medication Sig Dispense Auth. Provider   naproxen (NAPROSYN) 500 MG tablet Take 1 tablet (500 mg total) by mouth 2 (two) times daily as needed (knee pain). 14 tablet Radford Pax, NP      PDMP not reviewed this encounter.   Radford Pax, NP 08/08/23 1739

## 2023-08-08 NOTE — Discharge Instructions (Signed)
Use the knee sleeve for compression and support of the knee.  Elevate and ice as needed.  You may take naproxen twice daily as needed for pain.  Please follow-up with your PCP in 2 days for recheck.  Please go to the ER if you develop any worsening symptoms.  I hope you feel better soon!

## 2023-08-08 NOTE — ED Triage Notes (Signed)
Pt c/o L knee pain. States Throbbing Pain behind left knee and down into lower leg. Hurts to put weight on it. - Entered by patient

## 2023-08-15 DIAGNOSIS — M25562 Pain in left knee: Secondary | ICD-10-CM | POA: Insufficient documentation

## 2023-09-08 ENCOUNTER — Encounter: Payer: Self-pay | Admitting: Physician Assistant

## 2023-09-08 ENCOUNTER — Other Ambulatory Visit: Payer: Self-pay | Admitting: Physician Assistant

## 2023-09-08 DIAGNOSIS — M23204 Derangement of unspecified medial meniscus due to old tear or injury, left knee: Secondary | ICD-10-CM

## 2023-09-15 ENCOUNTER — Ambulatory Visit
Admission: RE | Admit: 2023-09-15 | Discharge: 2023-09-15 | Disposition: A | Discharge status: Home / Self Care | Payer: 59 | Source: Ambulatory Visit | Attending: Physician Assistant | Admitting: Physician Assistant

## 2023-09-15 DIAGNOSIS — M23204 Derangement of unspecified medial meniscus due to old tear or injury, left knee: Secondary | ICD-10-CM

## 2023-12-10 DIAGNOSIS — M1712 Unilateral primary osteoarthritis, left knee: Secondary | ICD-10-CM | POA: Insufficient documentation

## 2024-01-29 NOTE — Discharge Instructions (Addendum)
 Instructions after Total Knee Replacement   Erika P. Angie Fava., M.D.    Dept. of Orthopaedics & Sports Medicine [facility] [address] [place], [place]  [address]  Phone: [phone]   Fax: [phone]       [email or web address]       DIET: Drink plenty of non-alcoholic fluids. Resume your normal diet. Include foods high in fiber.  ACTIVITY:  You may use crutches or a walker with weight-bearing as tolerated, unless instructed otherwise. You may be weaned off of the walker or crutches by your Physical Therapist.  Do NOT place pillows under the knee. Anything placed under the knee could limit your ability to straighten the knee.   Use the Bone Foam 3 times a day for 30 minutes each session to help straighten the knee. Continue doing gentle exercises. Exercising will reduce the pain and swelling, increase motion, and prevent muscle weakness.   Please continue to use the TED compression stockings for 6 weeks. You may remove the stockings at night, but should reapply them in the morning. Do not drive or operate any equipment until instructed.  WOUND CARE:  The initial dressing (Aquacel) can remain in place for 7 days (see separate instructions). Continue to use the PolarCare or ice packs periodically to reduce pain and swelling. You may bathe or shower after the staples are removed at the first office visit following surgery.  MEDICATIONS: You may resume your regular medications. Please take the pain medication as prescribed on the medication. Do not take pain medication on an empty stomach. Unless instructed otherwise, you should take an enteric-coated aspirin 81 mg. TWICE a day. (This along with elevation will help reduce the possibility of blood clots/phlebitis in your operated leg.) Use a stool softener (such as Senokot-S or Colace) daily and a laxative (such as Miralax or Dulcolax) as needed to prevent constipation.  Do not drive or drink alcoholic beverages when  taking pain medications.  CALL THE OFFICE FOR: Temperature above 101 degrees Excessive bleeding or drainage on the dressing. Excessive swelling, coldness, or paleness of the toes. Persistent nausea and vomiting.  FOLLOW-UP:  You should have an appointment to return to the office in 10-14 days after surgery. Arrangements have been made for continuation of Physical Therapy (either home therapy or outpatient therapy).     [facility] Department Directory         [email or web address]       [email or web address]          Cardiology  Appointments: [place] - [phone]  Endocrinology  Appointments: [place] [phone] [place] - [phone]  Gastroenterology  Appointments: [place] [phone] [place] - [phone]        General Surgery   Appointments: [facility]  Internal Medicine/Family Medicine  Appointments: [facility] [place] - [phone] [place] - [phone]  Metabolic and Weigh Loss Surgery  Appointments: [facility]        Neurology  Appointments: [place] [phone] [place] - [phone]  Neurosurgery  Appointments: [place]  Obstetrics & Gynecology  Appointments: [place] [phone] [place] - [phone]        Pediatrics  Appointments: [place] [phone] [place] - [phone]  Physiatry  Appointments: [place] [phone]  Physical Therapy  Appointments: [place] - [phone]        Podiatry  Appointments: [place] [phone] [place] - [phone]  Pulmonology  Appointments: [place]  Rheumatology  Appointments: [place] [phone]  [place] Location: [facility]  [address] [place], [place]  [address]  [place] Location: [facility]. [address] [place], [place]  [address]  [place]  Location: [facility] [address] [place], [place]  [address]     [employer].  [address] , [place], [place], [address]. [phone] They will call you to arrange when they can come to see you

## 2024-01-31 ENCOUNTER — Encounter
Admission: RE | Admit: 2024-01-31 | Discharge: 2024-01-31 | Disposition: A | Discharge status: Home / Self Care | Source: Ambulatory Visit | Attending: Orthopedic Surgery | Admitting: Orthopedic Surgery

## 2024-01-31 ENCOUNTER — Other Ambulatory Visit: Payer: Self-pay

## 2024-01-31 VITALS — BP 120/85 | HR 65 | Resp 12 | Ht 68.0 in | Wt 261.7 lb

## 2024-01-31 DIAGNOSIS — R8281 Pyuria: Secondary | ICD-10-CM | POA: Insufficient documentation

## 2024-01-31 DIAGNOSIS — R8290 Unspecified abnormal findings in urine: Secondary | ICD-10-CM | POA: Diagnosis not present

## 2024-01-31 DIAGNOSIS — M1712 Unilateral primary osteoarthritis, left knee: Secondary | ICD-10-CM | POA: Diagnosis not present

## 2024-01-31 DIAGNOSIS — Z880 Allergy status to penicillin: Secondary | ICD-10-CM | POA: Insufficient documentation

## 2024-01-31 DIAGNOSIS — Z01810 Encounter for preprocedural cardiovascular examination: Secondary | ICD-10-CM | POA: Diagnosis not present

## 2024-01-31 DIAGNOSIS — Z01818 Encounter for other preprocedural examination: Secondary | ICD-10-CM | POA: Insufficient documentation

## 2024-01-31 DIAGNOSIS — Z01812 Encounter for preprocedural laboratory examination: Secondary | ICD-10-CM

## 2024-01-31 HISTORY — DX: Family history of other specified conditions: Z84.89

## 2024-01-31 HISTORY — DX: Localized edema: R60.0

## 2024-01-31 HISTORY — DX: Other specified disorders of the skin and subcutaneous tissue: L98.8

## 2024-01-31 HISTORY — DX: Sleep apnea, unspecified: G47.30

## 2024-01-31 HISTORY — DX: Other specified abnormal findings of blood chemistry: R79.89

## 2024-01-31 HISTORY — DX: Other amnesia: R41.3

## 2024-01-31 HISTORY — DX: Hyperlipidemia, unspecified: E78.5

## 2024-01-31 LAB — COMPREHENSIVE METABOLIC PANEL WITH GFR
ALT: 22 U/L (ref 0–44)
AST: 19 U/L (ref 15–41)
Albumin: 3.8 g/dL (ref 3.5–5.0)
Alkaline Phosphatase: 64 U/L (ref 38–126)
Anion gap: 8 (ref 5–15)
BUN: 17 mg/dL (ref 6–20)
CO2: 25 mmol/L (ref 22–32)
Calcium: 8.7 mg/dL — ABNORMAL LOW (ref 8.9–10.3)
Chloride: 106 mmol/L (ref 98–111)
Creatinine, Ser: 0.7 mg/dL (ref 0.44–1.00)
GFR, Estimated: >60 mL/min (ref >60)
Glucose, Bld: 103 mg/dL — ABNORMAL HIGH (ref 70–99)
Potassium: 4 mmol/L (ref 3.5–5.1)
Sodium: 139 mmol/L (ref 135–145)
Total Bilirubin: 0.8 mg/dL (ref 0.0–1.2)
Total Protein: 7.1 g/dL (ref 6.5–8.1)

## 2024-01-31 LAB — URINALYSIS, ROUTINE W REFLEX MICROSCOPIC
Bacteria, UA: NONE SEEN
Bilirubin Urine: NEGATIVE
Glucose, UA: NEGATIVE mg/dL
Ketones, ur: NEGATIVE mg/dL
Nitrite: NEGATIVE
Protein, ur: NEGATIVE mg/dL
Specific Gravity, Urine: 1.027 (ref 1.005–1.030)
pH: 5 (ref 5.0–8.0)

## 2024-01-31 LAB — SEDIMENTATION RATE: Sed Rate: 37 mm/h — ABNORMAL HIGH (ref 0–30)

## 2024-01-31 LAB — CBC
HCT: 36.3 % (ref 36.0–46.0)
Hemoglobin: 12.1 g/dL (ref 12.0–15.0)
MCH: 30.6 pg (ref 26.0–34.0)
MCHC: 33.3 g/dL (ref 30.0–36.0)
MCV: 91.9 fL (ref 80.0–100.0)
Platelets: 288 10*3/uL (ref 150–400)
RBC: 3.95 MIL/uL (ref 3.87–5.11)
RDW: 12.5 % (ref 11.5–15.5)
WBC: 8.1 10*3/uL (ref 4.0–10.5)
nRBC: 0 % (ref 0.0–0.2)

## 2024-01-31 LAB — SURGICAL PCR SCREEN
MRSA, PCR: NEGATIVE
Staphylococcus aureus: NEGATIVE

## 2024-01-31 LAB — C-REACTIVE PROTEIN: CRP: 0.5 mg/dL (ref <1.0)

## 2024-01-31 NOTE — Patient Instructions (Addendum)
 Your procedure is scheduled on:02-13-24 Monday Report to the Registration Desk on the 1st floor of the Medical Mall.Then proceed to the 2nd floor Surgery Desk To find out your arrival time, please call [phone] between 1PM - 3PM on:02-10-24 Friday If your arrival time is 6:00 am, do not arrive before that time as the Medical Mall entrance doors do not open until 6:00 am.  REMEMBER: Instructions that are not followed completely may result in serious medical risk, up to and including death; or upon the discretion of your surgeon and anesthesiologist your surgery may need to be rescheduled.  Do not eat food after midnight the night before surgery.  No gum chewing or hard candies.  You may however, drink CLEAR liquids up to 2 hours before you are scheduled to arrive for your surgery. Do not drink anything within 2 hours of your scheduled arrival time.  Clear liquids include: - water  - apple juice without pulp - gatorade (not RED colors) - black coffee or tea (Do NOT add milk or creamers to the coffee or tea) Do NOT drink anything that is not on this list  In addition, your doctor has ordered for you to drink the provided:  Ensure Pre-Surgery Clear Carbohydrate Drink  Drinking this carbohydrate drink up to two hours before surgery helps to reduce insulin resistance and improve patient outcomes. Please complete drinking 2 hours before scheduled arrival time.  One week prior to surgery:Last dose will be on 02-05-24 Stop Anti-inflammatories (NSAIDS) such as meloxicam  (MOBIC ), Advil, Aleve , Ibuprofen, Motrin, Naproxen , Naprosyn  and Aspirin based products such as Excedrin, Goody's Powder, BC Powder. Stop ANY OVER THE COUNTER supplements until after surgery.  You may however, continue to take Tylenol  if needed for pain up until the Tooth[place] of surgery.  Stop metFORMIN (GLUCOPHAGE) 2 days prior to surgery-Last dose will be on 02-10-24 Friday  Continue taking all of your other prescription  medications up until the [place] of surgery.  ON THE [place] OF SURGERY ONLY TAKE THESE MEDICATIONS WITH SIPS OF WATER: -escitalopram (LEXAPRO)  -You may take clonazePAM (KLONOPIN) if needed for anxiety  No Alcohol for 24 hours before or after surgery.  No Smoking including e-cigarettes for 24 hours before surgery.  No chewable tobacco products for at least 6 hours before surgery.  No nicotine patches on the [place] of surgery.  Do not use any "recreational" drugs for at least a week (preferably 2 weeks) before your surgery.  Please be advised that the combination of cocaine and anesthesia may have negative outcomes, up to and including death. If you test positive for cocaine, your surgery will be cancelled.  On the morning of surgery brush your teeth with toothpaste and water, you may rinse your mouth with mouthwash if you wish. Do not swallow any toothpaste or mouthwash.  Use CHG Soap as directed on instruction sheet.  Do not wear jewelry, make-up, hairpins, clips or nail polish.  For welded (permanent) jewelry: bracelets, anklets, waist bands, etc.  Please have this removed prior to surgery.  If it is not removed, there is a chance that hospital personnel will need to cut it off on the [place] of surgery.  Do not wear lotions, powders, or perfumes.   Do not shave body hair from the neck down 48 hours before surgery.  Contact lenses, hearing aids and dentures may not be worn into surgery.  Do not bring valuables to the hospital. [facility] is not responsible for any missing/lost belongings or valuables.  Notify your doctor if there is any change in your medical condition (cold, fever, infection).  Wear comfortable clothing (specific to your surgery type) to the hospital.  After surgery, you can help prevent lung complications by doing breathing exercises.  Take deep breaths and cough every 1-2 hours. Your doctor may order a device called an Incentive Spirometer to help you take deep  breaths. When coughing or sneezing, hold a pillow firmly against your incision with both hands. This is called "splinting." Doing this helps protect your incision. It also decreases belly discomfort.  If you are being admitted to the hospital overnight, leave your suitcase in the car. After surgery it may be brought to your room.  In case of increased patient census, it may be necessary for you, the patient, to continue your postoperative care in the [facility] department.  If you are being discharged the [place] of surgery, you will not be allowed to drive home. You will need a responsible individual to drive you home and stay with you for 24 hours after surgery.   If you are taking public transportation, you will need to have a responsible individual with you.  Please call the Pre-admissions Testing Dept. at [phone] if you have any questions about these instructions.  Surgery Visitation Policy:  Patients having surgery or a procedure may have two visitors.  Children under the age of 73 must have an adult with them who is not the patient.  Inpatient Visitation:    Visiting hours are 7 a.m. to 8 p.m. Up to four visitors are allowed at one time in a patient room. The visitors may rotate out with other people during the [place].  One visitor age 46 or older may stay with the patient overnight and must be in the room by 8 p.m.    Pre-operative 5 CHG Bath Instructions   You can play a key role in reducing the risk of infection after surgery. Your skin needs to be as free of germs as possible. You can reduce the number of germs on your skin by washing with CHG (chlorhexidine  gluconate) soap before surgery. CHG is an antiseptic soap that kills germs and continues to kill germs even after washing.   DO NOT use if you have an allergy to chlorhexidine /CHG or antibacterial soaps. If your skin becomes reddened or irritated, stop using the CHG and notify one of our RNs at [phone].    Please shower with the CHG soap starting 4 days before surgery using the following schedule:     Please keep in mind the following:  DO NOT shave, including legs and underarms, starting the [place] of your first shower.   You may shave your face at any point before/Abdelaziz of surgery.  Place clean sheets on your bed the [place] you start using CHG soap. Use a clean washcloth (not used since being washed) for each shower. DO NOT sleep with pets once you start using the CHG.   CHG Shower Instructions:  If you choose to wash your hair and private area, wash first with your normal shampoo/soap.  After you use shampoo/soap, rinse your hair and body thoroughly to remove shampoo/soap residue.  Turn the water OFF and apply about 3 tablespoons (45 ml) of CHG soap to a CLEAN washcloth.  Apply CHG soap ONLY FROM YOUR NECK DOWN TO YOUR TOES (washing for 3-5 minutes)  DO NOT use CHG soap on face, private areas, open wounds, or sores.  Pay special attention to  the area where your surgery is being performed.  If you are having back surgery, having someone wash your back for you may be helpful. Wait 2 minutes after CHG soap is applied, then you may rinse off the CHG soap.  Pat dry with a clean towel  Put on clean clothes/pajamas   If you choose to wear lotion, please use ONLY the CHG-compatible lotions on the back of this paper.     Additional instructions for the Carlo of surgery: DO NOT APPLY any lotions, deodorants, cologne, or perfumes.   Put on clean/comfortable clothes.  Brush your teeth.  Ask your nurse before applying any prescription medications to the skin.      CHG Compatible Lotions   Aveeno Moisturizing lotion  Cetaphil Moisturizing Cream  Cetaphil Moisturizing Lotion  Clairol Herbal Essence Moisturizing Lotion, Dry Skin  Clairol Herbal Essence Moisturizing Lotion, Extra Dry Skin  Clairol Herbal Essence Moisturizing Lotion, Normal Skin  Curel Age Defying Therapeutic Moisturizing Lotion  with Alpha Hydroxy  Curel Extreme Care Body Lotion  Curel Soothing Hands Moisturizing Hand Lotion  Curel Therapeutic Moisturizing Cream, Fragrance-Free  Curel Therapeutic Moisturizing Lotion, Fragrance-Free  Curel Therapeutic Moisturizing Lotion, Original Formula  Eucerin Daily Replenishing Lotion  Eucerin Dry Skin Therapy Plus Alpha Hydroxy Crme  Eucerin Dry Skin Therapy Plus Alpha Hydroxy Lotion  Eucerin Original Crme  Eucerin Original Lotion  Eucerin Plus Crme Eucerin Plus Lotion  Eucerin TriLipid Replenishing Lotion  Keri Anti-Bacterial Hand Lotion  Keri Deep Conditioning Original Lotion Dry Skin Formula Softly Scented  Keri Deep Conditioning Original Lotion, Fragrance Free Sensitive Skin Formula  Keri Lotion Fast Absorbing Fragrance Free Sensitive Skin Formula  Keri Lotion Fast Absorbing Softly Scented Dry Skin Formula  Keri Original Lotion  Keri Skin Renewal Lotion Keri Silky Smooth Lotion  Keri Silky Smooth Sensitive Skin Lotion  Nivea Body Creamy Conditioning Oil  Nivea Body Extra Enriched Lotion  Nivea Body Original Lotion  Nivea Body Sheer Moisturizing Lotion Nivea Crme  Nivea Skin Firming Lotion  NutraDerm 30 Skin Lotion  NutraDerm Skin Lotion  NutraDerm Therapeutic Skin Cream  NutraDerm Therapeutic Skin Lotion  ProShield Protective Hand Cream  Provon moisturizing lotion  How to Use an Incentive Spirometer An incentive spirometer is a tool that measures how well you are filling your lungs with each breath. Learning to take long, deep breaths using this tool can help you keep your lungs clear and active. This may help to reverse or lessen your chance of developing breathing (pulmonary) problems, especially infection. You may be asked to use a spirometer: After a surgery. If you have a lung problem or a history of smoking. After a long period of time when you have been unable to move or be active. If the spirometer includes an indicator to show the highest number  that you have reached, your health care provider or respiratory therapist will help you set a goal. Keep a log of your progress as told by your health care provider. What are the risks? Breathing too quickly may cause dizziness or cause you to pass out. Take your time so you do not get dizzy or light-headed. If you are in pain, you may need to take pain medicine before doing incentive spirometry. It is harder to take a deep breath if you are having pain. How to use your incentive spirometer  Sit up on the edge of your bed or on a chair. Hold the incentive spirometer so that it is in an upright position. Before you  use the spirometer, breathe out normally. Place the mouthpiece in your mouth. Make sure your lips are closed tightly around it. Breathe in slowly and as deeply as you can through your mouth, causing the piston or the ball to rise toward the top of the chamber. Hold your breath for 3-5 seconds, or for as long as possible. If the spirometer includes a coach indicator, use this to guide you in breathing. Slow down your breathing if the indicator goes above the marked areas. Remove the mouthpiece from your mouth and breathe out normally. The piston or ball will return to the bottom of the chamber. Rest for a few seconds, then repeat the steps 10 or more times. Take your time and take a few normal breaths between deep breaths so that you do not get dizzy or light-headed. Do this every 1-2 hours when you are awake. If the spirometer includes a goal marker to show the highest number you have reached (best effort), use this as a goal to work toward during each repetition. After each set of 10 deep breaths, cough a few times. This will help to make sure that your lungs are clear. If you have an incision on your chest or abdomen from surgery, place a pillow or a rolled-up towel firmly against the incision when you cough. This can help to reduce pain while taking deep breaths and coughing. General  tips When you are able to get out of bed: Walk around often. Continue to take deep breaths and cough in order to clear your lungs. Keep using the incentive spirometer until your health care provider says it is okay to stop using it. If you have been in the hospital, you may be told to keep using the spirometer at home. Contact a health care provider if: You are having difficulty using the spirometer. You have trouble using the spirometer as often as instructed. Your pain medicine is not giving enough relief for you to use the spirometer as told. You have a fever. Get help right away if: You develop shortness of breath. You develop a cough with bloody mucus from the lungs. You have fluid or blood coming from an incision site after you cough. Summary An incentive spirometer is a tool that can help you learn to take long, deep breaths to keep your lungs clear and active. You may be asked to use a spirometer after a surgery, if you have a lung problem or a history of smoking, or if you have been inactive for a long period of time. Use your incentive spirometer as instructed every 1-2 hours while you are awake. If you have an incision on your chest or abdomen, place a pillow or a rolled-up towel firmly against your incision when you cough. This will help to reduce pain. Get help right away if you have shortness of breath, you cough up bloody mucus, or blood comes from your incision when you cough. This information is not intended to replace advice given to you by your health care provider. Make sure you discuss any questions you have with your health care provider. Document Revised: 07/08/2023 Document Reviewed: 07/08/2023 Elsevier Patient Education  2024 [employer].  Preoperative Educational Videos for Total Hip, Knee and Shoulder Replacements  To better prepare for surgery, please view our videos that explain the physical activity and discharge planning required to have the best surgical  recovery at [facility].  [email or web address]  Questions? Call [phone] or email [email or web address]

## 2024-02-01 LAB — IGE: IgE (Immunoglobulin E), Serum: 30 [IU]/mL (ref 6–495)

## 2024-02-02 ENCOUNTER — Encounter: Payer: Self-pay | Admitting: Orthopedic Surgery

## 2024-02-02 LAB — URINE CULTURE: Culture: NO GROWTH

## 2024-02-13 ENCOUNTER — Observation Stay
Admission: RE | Admit: 2024-02-13 | Discharge: 2024-02-14 | Disposition: A | Discharge status: Home Health | Attending: Orthopedic Surgery | Admitting: Orthopedic Surgery

## 2024-02-13 ENCOUNTER — Ambulatory Visit: Admitting: Certified Registered"

## 2024-02-13 ENCOUNTER — Other Ambulatory Visit: Payer: Self-pay

## 2024-02-13 ENCOUNTER — Observation Stay

## 2024-02-13 ENCOUNTER — Ambulatory Visit: Payer: Self-pay | Admitting: Urgent Care

## 2024-02-13 ENCOUNTER — Encounter
Admission: RE | Disposition: A | Discharge status: Home Health | Payer: Self-pay | Source: Home / Self Care | Attending: Orthopedic Surgery

## 2024-02-13 ENCOUNTER — Encounter: Payer: Self-pay | Admitting: Orthopedic Surgery

## 2024-02-13 DIAGNOSIS — M1712 Unilateral primary osteoarthritis, left knee: Principal | ICD-10-CM | POA: Insufficient documentation

## 2024-02-13 DIAGNOSIS — Z7984 Long term (current) use of oral hypoglycemic drugs: Secondary | ICD-10-CM | POA: Diagnosis not present

## 2024-02-13 DIAGNOSIS — Z79899 Other long term (current) drug therapy: Secondary | ICD-10-CM | POA: Diagnosis not present

## 2024-02-13 DIAGNOSIS — Z880 Allergy status to penicillin: Secondary | ICD-10-CM

## 2024-02-13 DIAGNOSIS — Z96652 Presence of left artificial knee joint: Secondary | ICD-10-CM

## 2024-02-13 DIAGNOSIS — Z01818 Encounter for other preprocedural examination: Principal | ICD-10-CM

## 2024-02-13 HISTORY — PX: KNEE ARTHROPLASTY: SHX992

## 2024-02-13 SURGERY — ARTHROPLASTY, KNEE, TOTAL, USING IMAGELESS COMPUTER-ASSISTED NAVIGATION
Anesthesia: Spinal | Site: Knee | Laterality: Left

## 2024-02-13 MED ORDER — SODIUM CHLORIDE 0.9 % IR SOLN
Status: DC | PRN
  Administered 2024-02-13: 3000 mL

## 2024-02-13 MED ORDER — METOCLOPRAMIDE HCL 10 MG PO TABS
ORAL_TABLET | ORAL | Status: AC
  Filled 2024-02-13: qty 1

## 2024-02-13 MED ORDER — PROPOFOL 1000 MG/100ML IV EMUL
INTRAVENOUS | Status: AC
  Filled 2024-02-13: qty 100

## 2024-02-13 MED ORDER — BUPIVACAINE LIPOSOME 1.3 % IJ SUSP
INTRAMUSCULAR | Status: AC
  Filled 2024-02-13: qty 40

## 2024-02-13 MED ORDER — ACETAMINOPHEN 10 MG/ML IV SOLN
1000.0000 mg | Freq: Four times a day (QID) | INTRAVENOUS | Status: AC
  Administered 2024-02-13 – 2024-02-14 (×4): 1000 mg via INTRAVENOUS
  Filled 2024-02-13 (×2): qty 100

## 2024-02-13 MED ORDER — CELECOXIB 200 MG PO CAPS
200.0000 mg | ORAL_CAPSULE | Freq: Two times a day (BID) | ORAL | Status: DC
Start: 2024-02-13 — End: 2024-02-14
  Administered 2024-02-13 – 2024-02-14 (×2): 200 mg via ORAL
  Filled 2024-02-13 (×2): qty 1

## 2024-02-13 MED ORDER — ONDANSETRON HCL 4 MG/2ML IJ SOLN
INTRAMUSCULAR | Status: DC | PRN
Start: 2024-02-13 — End: 2024-02-13
  Administered 2024-02-13: 4 mg via INTRAVENOUS

## 2024-02-13 MED ORDER — DEXAMETHASONE SODIUM PHOSPHATE 10 MG/ML IJ SOLN
INTRAMUSCULAR | Status: AC
  Filled 2024-02-13: qty 1

## 2024-02-13 MED ORDER — TRANEXAMIC ACID-NACL 1000-0.7 MG/100ML-% IV SOLN
1000.0000 mg | INTRAVENOUS | Status: AC
  Administered 2024-02-13: 1000 mg via INTRAVENOUS

## 2024-02-13 MED ORDER — TRANEXAMIC ACID-NACL 1000-0.7 MG/100ML-% IV SOLN
INTRAVENOUS | Status: AC
  Filled 2024-02-13: qty 100

## 2024-02-13 MED ORDER — METFORMIN HCL 500 MG PO TABS
ORAL_TABLET | ORAL | Status: AC
  Filled 2024-02-13: qty 1

## 2024-02-13 MED ORDER — CLONAZEPAM 0.5 MG PO TABS: 0.5000 mg | ORAL_TABLET | Freq: Two times a day (BID) | ORAL | Status: DC | PRN

## 2024-02-13 MED ORDER — METOCLOPRAMIDE HCL 10 MG PO TABS
10.0000 mg | ORAL_TABLET | Freq: Three times a day (TID) | ORAL | Status: DC
  Administered 2024-02-13 – 2024-02-14 (×3): 10 mg via ORAL
  Filled 2024-02-13 (×2): qty 1

## 2024-02-13 MED ORDER — HYDROMORPHONE HCL 1 MG/ML IJ SOLN
0.5000 mg | INTRAMUSCULAR | Status: DC | PRN
  Administered 2024-02-13: 1 mg via INTRAVENOUS
  Filled 2024-02-13: qty 1

## 2024-02-13 MED ORDER — PHENYLEPHRINE HCL-NACL 20-0.9 MG/250ML-% IV SOLN
INTRAVENOUS | Status: AC
  Filled 2024-02-13: qty 250

## 2024-02-13 MED ORDER — CELECOXIB 200 MG PO CAPS
ORAL_CAPSULE | ORAL | Status: AC
  Filled 2024-02-13: qty 2

## 2024-02-13 MED ORDER — MIDAZOLAM HCL 5 MG/5ML IJ SOLN
INTRAMUSCULAR | Status: DC | PRN
  Administered 2024-02-13: 2 mg via INTRAVENOUS

## 2024-02-13 MED ORDER — ALUM & MAG HYDROXIDE-SIMETH 200-200-20 MG/5ML PO SUSP: 30.0000 mL | ORAL | Status: DC | PRN

## 2024-02-13 MED ORDER — PANTOPRAZOLE SODIUM 40 MG PO TBEC
40.0000 mg | DELAYED_RELEASE_TABLET | Freq: Two times a day (BID) | ORAL | Status: DC
Start: 2024-02-13 — End: 2024-02-14
  Administered 2024-02-13 – 2024-02-14 (×3): 40 mg via ORAL
  Filled 2024-02-13 (×3): qty 1

## 2024-02-13 MED ORDER — FENTANYL CITRATE (PF) 100 MCG/2ML IJ SOLN
INTRAMUSCULAR | Status: AC
Start: 2024-02-13 — End: ?
  Filled 2024-02-13: qty 2

## 2024-02-13 MED ORDER — ACETAMINOPHEN 10 MG/ML IV SOLN
INTRAVENOUS | Status: AC
  Filled 2024-02-13: qty 100

## 2024-02-13 MED ORDER — BISACODYL 10 MG RE SUPP: 10.0000 mg | Freq: Every day | RECTAL | Status: DC | PRN

## 2024-02-13 MED ORDER — CEFAZOLIN SODIUM-DEXTROSE 2-4 GM/100ML-% IV SOLN
2.0000 g | Freq: Four times a day (QID) | INTRAVENOUS | Status: AC
  Administered 2024-02-13 (×2): 2 g via INTRAVENOUS
  Filled 2024-02-13 (×2): qty 100

## 2024-02-13 MED ORDER — FERROUS SULFATE 325 (65 FE) MG PO TABS
325.0000 mg | ORAL_TABLET | Freq: Two times a day (BID) | ORAL | Status: DC
  Administered 2024-02-13 – 2024-02-14 (×2): 325 mg via ORAL
  Filled 2024-02-13: qty 1

## 2024-02-13 MED ORDER — CHLORHEXIDINE GLUCONATE 0.12 % MT SOLN
OROMUCOSAL | Status: AC
  Filled 2024-02-13: qty 15

## 2024-02-13 MED ORDER — PROPOFOL 500 MG/50ML IV EMUL
INTRAVENOUS | Status: DC | PRN
  Administered 2024-02-13: 125 ug/kg/min via INTRAVENOUS

## 2024-02-13 MED ORDER — FENTANYL CITRATE (PF) 100 MCG/2ML IJ SOLN: 25.0000 ug | INTRAMUSCULAR | Status: DC | PRN

## 2024-02-13 MED ORDER — FLEET ENEMA RE ENEM: 1.0000 | ENEMA | Freq: Once | RECTAL | Status: DC | PRN

## 2024-02-13 MED ORDER — DOXYCYCLINE HYCLATE 100 MG PO TABS: 100.0000 mg | ORAL_TABLET | Freq: Two times a day (BID) | ORAL | Status: DC | PRN

## 2024-02-13 MED ORDER — TRANEXAMIC ACID-NACL 1000-0.7 MG/100ML-% IV SOLN
1000.0000 mg | Freq: Once | INTRAVENOUS | Status: AC
  Administered 2024-02-13: 1000 mg via INTRAVENOUS

## 2024-02-13 MED ORDER — FERROUS SULFATE 325 (65 FE) MG PO TABS
ORAL_TABLET | ORAL | Status: AC
  Filled 2024-02-13: qty 1

## 2024-02-13 MED ORDER — DEXMEDETOMIDINE HCL IN NACL 80 MCG/20ML IV SOLN
INTRAVENOUS | Status: DC | PRN
  Administered 2024-02-13: 12 ug via INTRAVENOUS

## 2024-02-13 MED ORDER — METFORMIN HCL 500 MG PO TABS
1000.0000 mg | ORAL_TABLET | Freq: Every day | ORAL | Status: DC
  Administered 2024-02-13: 1000 mg via ORAL

## 2024-02-13 MED ORDER — ONDANSETRON HCL 4 MG PO TABS: 4.0000 mg | ORAL_TABLET | Freq: Four times a day (QID) | ORAL | Status: DC | PRN

## 2024-02-13 MED ORDER — FUROSEMIDE 20 MG PO TABS: 20.0000 mg | ORAL_TABLET | Freq: Every day | ORAL | Status: DC | PRN

## 2024-02-13 MED ORDER — ESCITALOPRAM OXALATE 20 MG PO TABS
20.0000 mg | ORAL_TABLET | ORAL | Status: DC
  Administered 2024-02-14: 20 mg via ORAL
  Filled 2024-02-13: qty 1

## 2024-02-13 MED ORDER — CELECOXIB 200 MG PO CAPS
400.0000 mg | ORAL_CAPSULE | Freq: Once | ORAL | Status: AC
  Administered 2024-02-13: 400 mg via ORAL

## 2024-02-13 MED ORDER — MAGNESIUM HYDROXIDE 400 MG/5ML PO SUSP
30.0000 mL | Freq: Every day | ORAL | Status: DC
  Administered 2024-02-14: 30 mL via ORAL
  Filled 2024-02-13: qty 30

## 2024-02-13 MED ORDER — OXYCODONE HCL 5 MG PO TABS: 5.0000 mg | ORAL_TABLET | Freq: Once | ORAL | Status: DC | PRN

## 2024-02-13 MED ORDER — DEXAMETHASONE SODIUM PHOSPHATE 10 MG/ML IJ SOLN
8.0000 mg | Freq: Once | INTRAMUSCULAR | Status: AC
  Administered 2024-02-13: 8 mg via INTRAVENOUS

## 2024-02-13 MED ORDER — PHENOL 1.4 % MT LIQD
1.0000 | OROMUCOSAL | Status: DC | PRN
Start: 2024-02-13 — End: 2024-02-14

## 2024-02-13 MED ORDER — ORAL CARE MOUTH RINSE: 15.0000 mL | Freq: Once | OROMUCOSAL | Status: DC

## 2024-02-13 MED ORDER — MENTHOL 3 MG MT LOZG: 1.0000 | LOZENGE | OROMUCOSAL | Status: DC | PRN

## 2024-02-13 MED ORDER — FENTANYL CITRATE (PF) 100 MCG/2ML IJ SOLN
INTRAMUSCULAR | Status: DC | PRN
  Administered 2024-02-13 (×2): 50 ug via INTRAVENOUS

## 2024-02-13 MED ORDER — ASPIRIN 81 MG PO CHEW
81.0000 mg | CHEWABLE_TABLET | Freq: Two times a day (BID) | ORAL | Status: DC
  Administered 2024-02-13 – 2024-02-14 (×2): 81 mg via ORAL
  Filled 2024-02-13 (×2): qty 1

## 2024-02-13 MED ORDER — SENNOSIDES-DOCUSATE SODIUM 8.6-50 MG PO TABS
1.0000 | ORAL_TABLET | Freq: Two times a day (BID) | ORAL | Status: DC
  Administered 2024-02-13 – 2024-02-14 (×2): 1 via ORAL
  Filled 2024-02-13 (×2): qty 1

## 2024-02-13 MED ORDER — ACETAMINOPHEN 325 MG PO TABS: 325.0000 mg | ORAL_TABLET | Freq: Four times a day (QID) | ORAL | Status: DC | PRN

## 2024-02-13 MED ORDER — LACTATED RINGERS IV SOLN: INTRAVENOUS | Status: DC

## 2024-02-13 MED ORDER — BUPIVACAINE HCL (PF) 0.5 % IJ SOLN
INTRAMUSCULAR | Status: DC | PRN
  Administered 2024-02-13: 2.8 mL

## 2024-02-13 MED ORDER — CELECOXIB 200 MG PO CAPS
ORAL_CAPSULE | ORAL | Status: AC
Start: 2024-02-13 — End: ?
  Filled 2024-02-13: qty 2

## 2024-02-13 MED ORDER — CHLORHEXIDINE GLUCONATE 0.12 % MT SOLN
15.0000 mL | Freq: Once | OROMUCOSAL | Status: DC
  Administered 2024-02-13: 15 mL via OROMUCOSAL

## 2024-02-13 MED ORDER — SODIUM CHLORIDE (PF) 0.9 % IJ SOLN
INTRAMUSCULAR | Status: AC
  Filled 2024-02-13: qty 80

## 2024-02-13 MED ORDER — LIDOCAINE HCL URETHRAL/MUCOSAL 2 % EX GEL
CUTANEOUS | Status: DC | PRN
  Administered 2024-02-13: 1 via TOPICAL

## 2024-02-13 MED ORDER — CEFAZOLIN SODIUM-DEXTROSE 3-4 GM/150ML-% IV SOLN
3.0000 g | INTRAVENOUS | Status: AC
  Administered 2024-02-13: 3 g via INTRAVENOUS
  Filled 2024-02-13: qty 150

## 2024-02-13 MED ORDER — OXYCODONE HCL 5 MG PO TABS: 5.0000 mg | ORAL_TABLET | ORAL | Status: DC | PRN

## 2024-02-13 MED ORDER — OXYCODONE HCL 5 MG/5ML PO SOLN: 5.0000 mg | Freq: Once | ORAL | Status: DC | PRN

## 2024-02-13 MED ORDER — SURGIPHOR WOUND IRRIGATION SYSTEM - OPTIME
TOPICAL | Status: DC | PRN
  Administered 2024-02-13: 450 mL

## 2024-02-13 MED ORDER — DIPHENHYDRAMINE HCL 12.5 MG/5ML PO ELIX: 12.5000 mg | ORAL_SOLUTION | ORAL | Status: DC | PRN

## 2024-02-13 MED ORDER — ACETAMINOPHEN 10 MG/ML IV SOLN
INTRAVENOUS | Status: DC | PRN
  Administered 2024-02-13: 1000 mg via INTRAVENOUS

## 2024-02-13 MED ORDER — METFORMIN HCL 500 MG PO TABS
ORAL_TABLET | ORAL | Status: AC
Start: 2024-02-13 — End: ?
  Filled 2024-02-13: qty 1

## 2024-02-13 MED ORDER — TRAMADOL HCL 50 MG PO TABS
50.0000 mg | ORAL_TABLET | ORAL | Status: DC | PRN
  Administered 2024-02-13 – 2024-02-14 (×2): 100 mg via ORAL
  Filled 2024-02-13 (×2): qty 2

## 2024-02-13 MED ORDER — BUPIVACAINE HCL (PF) 0.25 % IJ SOLN
INTRAMUSCULAR | Status: DC | PRN
  Administered 2024-02-13: 60 mL

## 2024-02-13 MED ORDER — SODIUM CHLORIDE 0.9 % IV SOLN
INTRAVENOUS | Status: DC | PRN
  Administered 2024-02-13: 60 mL

## 2024-02-13 MED ORDER — OXYCODONE HCL 5 MG PO TABS
10.0000 mg | ORAL_TABLET | ORAL | Status: DC | PRN
  Administered 2024-02-13 – 2024-02-14 (×4): 10 mg via ORAL
  Filled 2024-02-13 (×4): qty 2

## 2024-02-13 MED ORDER — SODIUM CHLORIDE 0.9 % IV SOLN
INTRAVENOUS | Status: DC
Start: 2024-02-13 — End: 2024-02-14

## 2024-02-13 MED ORDER — ENSURE PRE-SURGERY PO LIQD
296.0000 mL | Freq: Once | ORAL | Status: DC
  Administered 2024-02-13: 296 mL via ORAL
  Filled 2024-02-13: qty 296

## 2024-02-13 MED ORDER — BUPIVACAINE HCL (PF) 0.25 % IJ SOLN
INTRAMUSCULAR | Status: AC
  Filled 2024-02-13: qty 120

## 2024-02-13 MED ORDER — TRANEXAMIC ACID-NACL 1000-0.7 MG/100ML-% IV SOLN
INTRAVENOUS | Status: AC
Start: 2024-02-13 — End: ?
  Filled 2024-02-13: qty 100

## 2024-02-13 MED ORDER — MIDAZOLAM HCL 2 MG/2ML IJ SOLN
INTRAMUSCULAR | Status: AC
  Filled 2024-02-13: qty 2

## 2024-02-13 MED ORDER — CHLORHEXIDINE GLUCONATE 4 % EX SOLN
60.0000 mL | Freq: Once | CUTANEOUS | Status: AC
  Administered 2024-02-13: 4 via TOPICAL

## 2024-02-13 MED ORDER — ONDANSETRON HCL 4 MG/2ML IJ SOLN: 4.0000 mg | Freq: Four times a day (QID) | INTRAMUSCULAR | Status: DC | PRN

## 2024-02-13 MED ORDER — CHOLESTYRAMINE LIGHT 4 G PO PACK
2.0000 g | PACK | Freq: Two times a day (BID) | ORAL | Status: DC
  Administered 2024-02-13: 2 g via ORAL
  Filled 2024-02-13 (×3): qty 1

## 2024-02-13 MED ORDER — POTASSIUM CHLORIDE CRYS ER 20 MEQ PO TBCR: 20.0000 meq | EXTENDED_RELEASE_TABLET | Freq: Every day | ORAL | Status: DC | PRN

## 2024-02-13 SURGICAL SUPPLY — 63 items
ATTUNE PSFEM LTSZ5 NARCEM KNEE (Femur) IMPLANT
ATTUNE PSRP INSR SZ5 5 KNEE (Insert) IMPLANT
BASEPLATE TIBIAL ROTATING SZ 4 (Knees) IMPLANT
BATTERY INSTRU NAVIGATION (MISCELLANEOUS) ×4 IMPLANT
BIT DRILL QUICK REL 1/8 2PK SL (BIT) ×1 IMPLANT
BLADE CLIPPER SURG (BLADE) IMPLANT
BLADE SAW 70X12.5 (BLADE) ×1 IMPLANT
BLADE SAW 90X13X1.19 OSCILLAT (BLADE) ×1 IMPLANT
BLADE SAW 90X25X1.19 OSCILLAT (BLADE) ×1 IMPLANT
BRUSH SCRUB EZ PLAIN DRY (MISCELLANEOUS) ×1 IMPLANT
CEMENT BONE GENTAMICIN 40 (Cement) IMPLANT
COOLER POLAR GLACIER W/PUMP (MISCELLANEOUS) ×1 IMPLANT
CUFF TRNQT CYL 24X4X16.5-23 (TOURNIQUET CUFF) IMPLANT
CUFF TRNQT CYL 30X4X21-28X (TOURNIQUET CUFF) IMPLANT
DRAPE SHEET LG 3/4 BI-LAMINATE (DRAPES) ×1 IMPLANT
DRSG AQUACEL AG ADV 3.5X14 (GAUZE/BANDAGES/DRESSINGS) ×1 IMPLANT
DRSG MEPILEX SACRM 8.7X9.8 (GAUZE/BANDAGES/DRESSINGS) ×1 IMPLANT
DRSG TEGADERM 4X4.75 (GAUZE/BANDAGES/DRESSINGS) ×1 IMPLANT
DURAPREP 26ML APPLICATOR (WOUND CARE) ×2 IMPLANT
ELECT CAUTERY BLADE 6.4 (BLADE) ×1 IMPLANT
ELECTRODE REM PT RTRN 9FT ADLT (ELECTROSURGICAL) ×1 IMPLANT
EVACUATOR 1/8 PVC DRAIN (DRAIN) ×1 IMPLANT
EX-PIN ORTHOLOCK NAV 4X150 (PIN) ×2 IMPLANT
GAUZE XEROFORM 1X8 LF (GAUZE/BANDAGES/DRESSINGS) ×1 IMPLANT
GLOVE BIOGEL M STRL SZ7.5 (GLOVE) ×6 IMPLANT
GLOVE BIOGEL PI IND STRL 8 (GLOVE) ×1 IMPLANT
GLOVE SRG 8 PF TXTR STRL LF DI (GLOVE) ×1 IMPLANT
GOWN STRL REUS W/ TWL LRG LVL3 (GOWN DISPOSABLE) ×1 IMPLANT
GOWN STRL REUS W/ TWL XL LVL3 (GOWN DISPOSABLE) ×1 IMPLANT
GOWN TOGA ZIPPER T7+ PEEL AWAY (MISCELLANEOUS) ×1 IMPLANT
HOLDER FOLEY CATH W/STRAP (MISCELLANEOUS) ×1 IMPLANT
HOOD PEEL AWAY T7 (MISCELLANEOUS) ×1 IMPLANT
KIT TURNOVER KIT A (KITS) ×1 IMPLANT
KNIFE SCULPS 14X20 (INSTRUMENTS) ×1 IMPLANT
MANIFOLD NEPTUNE II (INSTRUMENTS) ×2 IMPLANT
NDL SPNL 20GX3.5 QUINCKE YW (NEEDLE) ×2 IMPLANT
NEEDLE SPNL 20GX3.5 QUINCKE YW (NEEDLE) ×2 IMPLANT
PACK TOTAL KNEE (MISCELLANEOUS) ×1 IMPLANT
PAD ABD DERMACEA PRESS 5X9 (GAUZE/BANDAGES/DRESSINGS) ×2 IMPLANT
PAD ARMBOARD POSITIONER FOAM (MISCELLANEOUS) ×3 IMPLANT
PAD WRAPON POLAR KNEE (MISCELLANEOUS) ×1 IMPLANT
PATELLA MEDIAL ATTUN 35MM KNEE (Knees) IMPLANT
PENCIL SMOKE EVACUATOR COATED (MISCELLANEOUS) ×1 IMPLANT
PIN DRILL FIX HALF THREAD (BIT) ×2 IMPLANT
PIN FIXATION 1/8DIA X 3INL (PIN) ×1 IMPLANT
SOL .9 NS 3000ML IRR UROMATIC (IV SOLUTION) ×1 IMPLANT
SOLUTION IRRIG SURGIPHOR (IV SOLUTION) ×1 IMPLANT
SPONGE DRAIN TRACH 4X4 STRL 2S (GAUZE/BANDAGES/DRESSINGS) ×1 IMPLANT
STAPLER SKIN PROX 35W (STAPLE) ×1 IMPLANT
STOCKINETTE IMPERV 14X48 (MISCELLANEOUS) ×1 IMPLANT
STOCKINETTE STRL BIAS CUT 8X4 (MISCELLANEOUS) ×1 IMPLANT
STRAP TIBIA SHORT (MISCELLANEOUS) ×1 IMPLANT
SUCTION TUBE FRAZIER 10FR DISP (SUCTIONS) ×1 IMPLANT
SUT VIC AB 0 CT1 36 (SUTURE) ×1 IMPLANT
SUT VIC AB 1 CT1 36 (SUTURE) ×2 IMPLANT
SUT VIC AB 2-0 CT2 27 (SUTURE) ×1 IMPLANT
SYR 30ML LL (SYRINGE) ×2 IMPLANT
TIP FAN IRRIG PULSAVAC PLUS (DISPOSABLE) ×1 IMPLANT
TOWEL OR 17X26 4PK STRL BLUE (TOWEL DISPOSABLE) ×1 IMPLANT
TOWER CARTRIDGE SMART MIX (DISPOSABLE) ×1 IMPLANT
TRAP FLUID SMOKE EVACUATOR (MISCELLANEOUS) ×1 IMPLANT
TRAY FOLEY MTR SLVR 16FR STAT (SET/KITS/TRAYS/PACK) ×1 IMPLANT
WATER STERILE IRR 1000ML POUR (IV SOLUTION) ×1 IMPLANT

## 2024-02-13 NOTE — Progress Notes (Signed)
 Patient is not able to walk the distance required to go the bathroom, or he/she is unable to safely negotiate stairs required to access the bathroom.  A 3in1 BSC will alleviate this problem   Amenda Duclos P. Angie Fava M.D.

## 2024-02-13 NOTE — H&P (Signed)
 ORTHOPAEDIC HISTORY & PHYSICAL Erika Roberts, Kalvin Orf., MD - 02/03/2024 9:00 AM EDT Formatting of this note is different from the original. Images from the original note were not included. Chief Complaint: Chief Complaint Patient presents with Pre-op Exam H&P - LT TKA - 06.02.25/J[facility]   Reason for Visit: The patient is a 53 y.o. female who presents today with her wife for reevaluation of her left knee. She has a several month history of left knee pain without any specific trauma or injury. She localizes most of the pain along the medial and anterior aspect of the knee although she did report a previous "pop" with posterior knee pain. She reports some swelling, no locking, and some giving way of the knee. The pain is aggravated by going up and down stairs, rising after sitting, standing, and walking. The knee pain limits the patient's ability to ambulate long distances. The patient has not appreciated any significant improvement despite Tylenol , knee brace, NSAIDs, activity modification, intraarticular corticosteroid injection., and viscosupplementation She is not using any ambulatory aids. The patient states that the knee pain has progressed to the point that it is significantly interfering with her activities of daily living.  Medications: Current Outpatient Medications Medication Sig Dispense Refill acetaminophen  (TYLENOL ) 500 MG tablet Take 1,000 mg by mouth every 8 (eight) hours as needed for Pain acetaminophen  (TYLENOL ) 500 MG tablet Take 1,000 mg by mouth every 6 (six) hours as needed cholestyramine (CHOLESTYRAMINE LIGHT) 4 gram oral powder packet Take 2 g by mouth 2 (two) times daily CHOLESTYRAMINE LIGHT 4 gram oral powder Take 4 g by mouth every morning before breakfast clonazePAM (KLONOPIN) 0.5 MG tablet Take 0.5 mg by mouth 2 (two) times daily as needed doxycycline (VIBRAMYCIN) 100 MG capsule Take 100 mg by mouth 2 (two) times daily as needed FUROsemide (LASIX) 20 MG tablet Take 20  mg by mouth as needed meloxicam  (MOBIC ) 15 MG tablet Take 15 mg by mouth once daily Take with food. metaxalone (SKELAXIN) 800 mg tablet Take 800 mg by mouth 3 (three) times daily metFORMIN (GLUCOPHAGE) 500 MG tablet Take 1,000 mg by mouth daily with breakfast potassium chloride (K-TAB) 20 mEq TbER ER tablet Take 1 tablet by mouth at bedtime as needed escitalopram oxalate (LEXAPRO) 20 MG tablet Take 20 mg by mouth once daily pregabalin (LYRICA) 75 MG capsule Take 75 mg by mouth 3 (three) times daily  No current facility-administered medications for this visit.  Allergies: Allergies Allergen Reactions Penicillin G Hives IgE = 30 (WNL) on 01/31/2024 Venom-Honey Bee Swelling and Anaphylaxis Swelling at site uses benadryl  Hyperventilates Gabapentin Swelling and Hives High blood pressure and peripheral swelling  High blood pressure Peripheral swelling Penicillins Hives  Past Medical History: Past Medical History: Diagnosis Date Anxiety Chronic right-sided low back pain with sciatica Depression Ganglion History of kidney stones Left carpal tunnel syndrome Meralgia paresthetica of right side Panic attacks  Past Surgical History: Past Surgical History: Procedure Laterality Date Cysto /uretetero w/lithotriopsy & indwelling stent 05/19/2021 03/18/22, Left retrograde pyelogram 03/18/2022 Dr D Viprakasit COLONOSCOPY Diagnostic hysteroscopy and D&C -uterine polyp removed REDUCTION MAMMAPLASTY  Social History: Social History  Socioeconomic History Marital status: Married Spouse name: Erika Roberts Number of children: 0 Years of education: 16 Highest education level: Bachelor's degree (e.g., BA, AB, BS) Occupational History Occupation: Scientist, 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[employer] [place] Tobacco Use Smoking status: Never Smokeless tobacco: Never Vaping Use Vaping status: Never Used Substance and Sexual Activity Alcohol use: Yes Alcohol/week: 4.0 standard drinks of  alcohol Types: 4 Cans of beer per week Drug use: Never Sexual activity: Yes Partners: Female Comment: Lesbian  Social Drivers of Health  Financial Resource Strain: Low Risk (12/12/2023) Received from [facility] Overall Financial Resource Strain (CARDIA) Difficulty of Paying Living Expenses: Not hard at all Food Insecurity: No Food Insecurity (12/12/2023) Received from [facility] Hunger Vital Sign Worried About Running Out of Food in the Last Year: Never true Ran Out of Food in the Last Year: Never true Transportation Needs: No Transportation Needs (12/12/2023) Received from [facility] - Transportation Lack of Transportation (Medical): No Lack of Transportation (Non-Medical): No Received from [facility] Social [facility]ctions Housing Stability: Low Risk (12/12/2023) Received from [facility] Housing Within the past 12 months, have you ever stayed: outside, in a car, in a tent, in an overnight shelter, or temporarily in someone else's home(i.e.couch-surfing)?: No Are you worried about losing your housing?: No  Family History: No family history on file.  Review of Systems: A comprehensive 14 point ROS was performed, reviewed, and the pertinent orthopaedic findings are documented in the HPI.  Exam BP 134/68  Ht 170.2 cm (5\' 7" )  Wt (!) 118.4 kg (261 lb)  LMP 10/04/2022  BMI 40.88 kg/m  General: Well-developed, well-nourished female seen in no acute distress. Antalgic gait. Varus thrust to the left knee.  HEENT: Atraumatic, normocephalic. Pupils are equal and reactive to light. Extraocular motion is intact. Sclera are clear. Oropharynx is clear with moist mucosa.  Neck: Supple, nontender, and with good ROM. No thyromegaly, adenopathy, JVD, or carotid bruits.  Lungs: Clear to auscultation bilaterally.  Cardiovascular: Regular rate and rhythm. Normal S1, S2. No murmur . No appreciable gallops or  rubs. Peripheral pulses are palpable. No lower extremity edema. Homan`s test is negative.  Abdomen: Soft, nontender, nondistended. Bowel sounds are present.  Extremities: Good strength, stability, and range of motion of the upper extremities. Good range of motion of the hips and ankles.  Left Knee: Soft tissue swelling: minimal Effusion: minimal Erythema: none Crepitance: mild Tenderness: medial Alignment: relative varus Mediolateral laxity: medial pseudolaxity Posterior sag: negative Patellar tracking: Good tracking without evidence of subluxation or tilt Atrophy: No significant atrophy. Quadriceps tone was good. Range of motion: 0/0/121 degrees  Neurologic: Awake, alert, and oriented. Sensory function is intact to pinprick and light touch. Motor strength is judged to be 5/5. Motor coordination is within normal limits. No apparent clonus. No tremor.  Radiographs: I ordered and interpreted standing AP, lateral, and sunrise radiographs of the left knee that were obtained in the office today. There is significant narrowing of the medial cartilage space with associated varus alignment. Subchondral sclerosis is noted. Degenerative changes to the patellofemoral articulation are noted. No evidence of fracture or dislocation.  Impression: Degenerative arthrosis of the left knee  Plan: The findings were discussed in detail with the patient. The patient was given informational material on total knee replacement. Conservative treatment options were reviewed with the patient. We discussed the risks and benefits of surgical intervention. The usual perioperative course was also discussed in detail. The patient expressed understanding of the risks and benefits of surgical intervention and would like to proceed with plans for left total knee arthroplasty in June.  I spent a total of 45 minutes in both face-to-face and non-face-to-face activities, excluding procedures performed, for this  visit on the date of this encounter.  MEDICAL CLEARANCE: Per anesthesiology. ACTIVITY: As tolerated. WORK STATUS: Anticipate out  of work for 6-8 weeks following surgery. THERAPY: Preoperative physical therapy evaluation. MEDICATIONS: Requested Prescriptions  No prescriptions requested or ordered in this encounter  FOLLOW-UP: Return for postoperative follow-up.  Erika Roberts P. Nakesha Ebrahim, Jr., M.D.  This note was generated in part with voice recognition software and I apologize for any typographical errors that were not detected and corrected. Electronically signed by Marilou Showman., MD at 02/06/2024 3:34 PM EDT

## 2024-02-13 NOTE — Discharge Summary (Signed)
 Physician Discharge Summary  Subjective: 1 [place] Post-Op Procedure(s) (LRB): ARTHROPLASTY, KNEE, TOTAL, USING IMAGELESS COMPUTER-ASSISTED NAVIGATION (Left) Patient reports pain as mild.   Patient seen in rounds with Dr. Aubry Blase. Patient is well, and has had no acute complaints or problems Denies any CP, SOB, N/V, fevers or chills We will start therapy today.  Patient is ready to go home  Physician Discharge Summary  Patient ID: Erika Roberts MRN: [id number] DOB/AGE: July 28, 1971 54 y.o.  Admit date: 02/13/2024 Discharge date: 02/14/2024  Admission Diagnoses:  Discharge Diagnoses:  Principal Problem:   History of total knee arthroplasty, left   Discharged Condition: good  Hospital Course: Patient presented to the hospital on 02/13/2024 for an elective left total knee arthroplasty performed by Dr. Aubry Blase. Patient was given 1g of TXA and 3g of Ancef  prior to the procedure. she tolerated the procedure well without any complications. See procedural note below for details. Postoperatively, the patient did very well. she was able to pass PT protocols on post-op Eliasen one without any issues. JP drain was removed without any difficulty and was intact. she was able to void her bladder without any difficulty. Physical exam was unremarkable. she denies any SOB, CP, N/V, fevers or chills. Vital signs are stable. Patient is stable to discharge home.  PROCEDURE:  Left total knee arthroplasty using computer-assisted navigation   SURGEON:  Maxene Span. M.D.   ASSISTANT:  Benjiman Bras, PA-C (present and scrubbed throughout the case, critical for assistance with exposure, retraction, instrumentation, and closure)   ANESTHESIA: spinal   ESTIMATED BLOOD LOSS: 50 mL   FLUIDS REPLACED: 700 mL of crystalloid   TOURNIQUET TIME: 93 minutes   DRAINS: 2 medium Hemovac drains   SOFT TISSUE RELEASES: Anterior cruciate ligament, posterior cruciate ligament, deep medial collateral ligament, patellofemoral  ligament   IMPLANTS UTILIZED: DePuy Attune size 5N posterior stabilized femoral component (cemented), size 4 rotating platform tibial component (cemented), 35 mm medialized dome patella (cemented), and a 5 mm stabilized rotating platform polyethylene insert.  Treatments: none  Discharge Exam: Blood pressure 135/74, pulse 82, temperature 98.5 F (36.9 C), resp. rate 18, height 5\' 8"  (1.727 m), weight 117.9 kg, last menstrual period 10/06/2020, SpO2 98%.   Disposition: Home   Allergies as of 02/14/2024       Reactions   Bee Venom Anaphylaxis   Penicillin G Hives   IgE = 30 (WNL) on 01/31/2024   Gabapentin Swelling, Other (See Comments)   High blood pressure Peripheral swelling        Medication List     TAKE these medications    acetaminophen  500 MG tablet Commonly known as: TYLENOL  Take 1,000 mg by mouth every 6 (six) hours as needed.   aspirin 81 MG chewable tablet Chew 1 tablet (81 mg total) by mouth 2 (two) times daily.   cholestyramine light 4 g packet Commonly known as: PREVALITE Take 2 g by mouth 2 (two) times daily.   clonazePAM 0.5 MG tablet Commonly known as: KLONOPIN Take 0.5 mg by mouth 2 (two) times daily as needed for anxiety.   COLD AND FLU PO Take 2 tablets by mouth as needed.   doxycycline 100 MG capsule Commonly known as: VIBRAMYCIN Take 100 mg by mouth 2 (two) times daily as needed (rash).   escitalopram 20 MG tablet Commonly known as: LEXAPRO Take 20 mg by mouth every morning.   furosemide 20 MG tablet Commonly known as: LASIX Take 20 mg by mouth daily as needed for edema.  meloxicam  15 MG tablet Commonly known as: MOBIC  Take 1 tablet (15 mg total) by mouth daily. Take with food.   metFORMIN 500 MG tablet Commonly known as: GLUCOPHAGE Take 1,000 mg by mouth daily. Takes for weight loss   oxyCODONE  5 MG immediate release tablet Commonly known as: Oxy IR/ROXICODONE  Take 1 tablet (5 mg total) by mouth every 4 (four) hours as needed  for moderate pain (pain score 4-6) (pain score 4-6).   Potassium Chloride ER 20 MEQ Tbcr Take 20 mEq by mouth daily as needed (when taking lasix).   traMADol 50 MG tablet Commonly known as: ULTRAM Take 1-2 tablets (50-100 mg total) by mouth every 4 (four) hours as needed for moderate pain (pain score 4-6).               Durable Medical Equipment  (From admission, onward)           Start     Ordered   02/13/24 1216  DME Walker rolling  Once       Question:  Patient needs a walker to treat with the following condition  Answer:  Total knee replacement status   02/13/24 1215   02/13/24 1216  DME Bedside commode  Once       Comments: Patient is not able to walk the distance required to go the bathroom, or he/she is unable to safely negotiate stairs required to access the bathroom.  A 3in1 BSC will alleviate this problem  Question:  Patient needs a bedside commode to treat with the following condition  Answer:  Total knee replacement status   02/13/24 1215            Follow-up Information     Bert Britain, PA-C Follow up on 02/27/2024.   Specialty: Orthopedic Surgery Why: at 9:45am Contact information: [address] [place] [address] [phone]         Arlyne Lame, MD Follow up on 03/27/2024.   Specialty: Orthopedic Surgery Why: at 9:30am Contact information: [address] [facility] [place] [address] [phone]                 Signed: Benjiman Bras 02/14/2024, 8:45 AM   Objective: Vital signs in last 24 hours: Temp:  [96.9 F (36.1 C)-98.5 F (36.9 C)] 98.5 F (36.9 C) (06/03 0745) Pulse Rate:  [66-91] 82 (06/03 0745) Resp:  [13-18] 18 (06/03 0745) BP: (85-135)/(50-74) 135/74 (06/03 0745) SpO2:  [93 %-98 %] 98 % (06/03 0745)  Intake/Output from previous [facility]:  Intake/Output Summary (Last 24 hours) at 02/14/2024 0845 Last data filed at 02/14/2024 0405 Gross per 24 hour  Intake 1130 ml   Output 1375 ml  Net -245 ml    Intake/Output this shift: No intake/output data recorded.  Labs: No results for input(s): "HGB" in the last 72 hours. No results for input(s): "WBC", "RBC", "HCT", "PLT" in the last 72 hours. No results for input(s): "NA", "K", "CL", "CO2", "BUN", "CREATININE", "GLUCOSE", "CALCIUM" in the last 72 hours. No results for input(s): "LABPT", "INR" in the last 72 hours.  EXAM: General - Patient is Alert, Appropriate, and Oriented Extremity - Neurologically intact Neurovascular intact Sensation intact distally Intact pulses distally Dorsiflexion/Plantar flexion intact No cellulitis present Compartment soft Dressing - dressing C/D/I and no drainage Motor Function - intact, moving foot and toes well on exam. JP Drain pulled without difficulty. Intact  Assessment/Plan: 1 [facility] Post-Op Procedure(s) (LRB): ARTHROPLASTY, KNEE, TOTAL, USING IMAGELESS COMPUTER-ASSISTED  NAVIGATION (Left) Procedure(s) (LRB): ARTHROPLASTY, KNEE, TOTAL, USING IMAGELESS COMPUTER-ASSISTED NAVIGATION (Left) Past Medical History:  Diagnosis Date   Anxiety    Chronic back pain    COVID    Depression    Elevated homocysteine    Family history of adverse reaction to anesthesia    mom-delayed emergence   History of kidney stones    Hyperlipidemia    Leg edema    Lesion of skin of breast    Memory loss, short term    Sleep apnea    does not use cpap   Principal Problem:   History of total knee arthroplasty, left  Estimated body mass index is 39.53 kg/m as calculated from the following:   Height as of this encounter: 5\' 8"  (1.727 m).   Weight as of this encounter: 117.9 kg.  Patient will continue to work with physical therapy   Discussed with the patient continuing to utilize Polar Care   Patient will use bone foam in 20-30 minute intervals   Patient will wear TED hose bilaterally to help prevent DVT and clot formation   Discussed the Aquacel bandage.  This bandage  will stay in place 7 days postoperatively.  Can be replaced with honeycomb bandages that will be sent home with the patient   Discussed sending the patient home with tramadol and oxycodone  for as needed pain management.  Patient will also be continue taking at home Meloxicam  to help with swelling and inflammation.  Patient will take an 81 mg aspirin twice daily for DVT prophylaxis   JP drain removed without difficulty, intact   Weight-Bearing as tolerated to left leg   Patient will follow-up with [facility] orthopedics in 2 weeks for staple removal and reevaluation  Diet - Regular diet Follow up - in 2 weeks Activity - WBAT Disposition - Home Condition Upon Discharge - Good DVT Prophylaxis - Aspirin and TED hose  Standley Earing, PA-C Orthopaedic Surgery 02/14/2024, 8:45 AM

## 2024-02-13 NOTE — Interval H&P Note (Signed)
 History and Physical Interval Note:  02/13/2024 6:12 AM  Erika Roberts  has presented today for surgery, with the diagnosis of Primary osteoarthritis of left knee.  The various methods of treatment have been discussed with the patient and family. After consideration of risks, benefits and other options for treatment, the patient has consented to  Procedure(s): ARTHROPLASTY, KNEE, TOTAL, USING IMAGELESS COMPUTER-ASSISTED NAVIGATION (Left) as a surgical intervention.  The patient's history has been reviewed, patient examined, no change in status, stable for surgery.  I have reviewed the patient's chart and labs.  Questions were answered to the patient's satisfaction.     [name]

## 2024-02-13 NOTE — Anesthesia Preprocedure Evaluation (Signed)
 Anesthesia Evaluation  Patient identified by MRN, date of birth, ID band Patient awake    Reviewed: Allergy & Precautions, H&P , NPO status , Patient's Chart, lab work & pertinent test results, reviewed documented beta blocker date and time   Airway Mallampati: II  TM Distance: >3 FB Neck ROM: full    Dental  (+) Teeth Intact, Dental Advidsory Given   Pulmonary sleep apnea    Pulmonary exam normal        Cardiovascular Exercise Tolerance: Good negative cardio ROS Normal cardiovascular exam Rhythm:regular Rate:Normal     Neuro/Psych  PSYCHIATRIC DISORDERS Anxiety Depression    negative neurological ROS     GI/Hepatic negative GI ROS, Neg liver ROS,,,  Endo/Other  negative endocrine ROS    Renal/GU      Musculoskeletal   Abdominal   Peds  Hematology negative hematology ROS (+)   Anesthesia Other Findings Past Medical History: No date: Anxiety No date: Chronic back pain No date: COVID No date: Depression No date: History of kidney stones Past Surgical History: No date: BREAST REDUCTION SURGERY No date: COLONOSCOPY No date: CYSTOSCOPY WITH HOLMIUM LASER LITHOTRIPSY; Left     Comment:  x2 No date: WRIST GANGLION EXCISION     Comment:  bilateral wrist BMI    Body Mass Index: 39.53 kg/m     Reproductive/Obstetrics negative OB ROS                             Anesthesia Physical Anesthesia Plan  ASA: 2  Anesthesia Plan: Spinal   Post-op Pain Management:    Induction:   PONV Risk Score and Plan: 2 and Ondansetron , Dexamethasone , Midazolam , Propofol  infusion and TIVA  Airway Management Planned: Natural Airway and Nasal Cannula  Additional Equipment:   Intra-op Plan:   Post-operative Plan:   Informed Consent: I have reviewed the patients History and Physical, chart, labs and discussed the procedure including the risks, benefits and alternatives for the proposed anesthesia  with the patient or authorized representative who has indicated his/her understanding and acceptance.     Dental Advisory Given  Plan Discussed with: Anesthesiologist, CRNA and Surgeon  Anesthesia Plan Comments: (Patient reports no bleeding problems and no anticoagulant use.  Plan for spinal with backup GA  Patient consented for risks of anesthesia including but not limited to:  - adverse reactions to medications - damage to eyes, teeth, lips or other oral mucosa - nerve damage due to positioning  - risk of bleeding, infection and or nerve damage from spinal that could lead to paralysis - risk of headache or failed spinal - damage to teeth, lips or other oral mucosa - sore throat or hoarseness - damage to heart, brain, nerves, lungs, other parts of body or loss of life  Patient voiced understanding and assent.)       Anesthesia Quick Evaluation

## 2024-02-13 NOTE — Plan of Care (Signed)
  Problem: Activity: Goal: Range of joint motion will improve Outcome: Progressing   Problem: Clinical Measurements: Goal: Postoperative complications will be avoided or minimized Outcome: Progressing   Problem: Pain Management: Goal: Pain level will decrease with appropriate interventions Outcome: Progressing   Problem: Skin Integrity: Goal: Will show signs of wound healing Outcome: Progressing

## 2024-02-13 NOTE — Transfer of Care (Signed)
 Immediate Anesthesia Transfer of Care Note  Patient: Erika Roberts  Procedure(s) Performed: ARTHROPLASTY, KNEE, TOTAL, USING IMAGELESS COMPUTER-ASSISTED NAVIGATION (Left: Knee)  Patient Location: PACU  Anesthesia Type:General  Level of Consciousness: awake, alert , and oriented  Airway & Oxygen Therapy: Patient Spontanous Breathing and Patient connected to nasal cannula oxygen  Post-op Assessment: Report given to RN and Post -op Vital signs reviewed and stable  Post vital signs: stable  Last Vitals:  Vitals Value Taken Time  BP 85/50 02/13/24 1100  Temp    Pulse 72 02/13/24 1103  Resp 12 02/13/24 1103  SpO2 94 % 02/13/24 1103  Vitals shown include unfiled device data.  Last Pain:  Vitals:   02/13/24 0624  TempSrc: Temporal  PainSc: 8          Complications: No notable events documented.

## 2024-02-13 NOTE — Progress Notes (Signed)
 Subjective: 1 [place] Post-Op Procedure(s) (LRB): ARTHROPLASTY, KNEE, TOTAL, USING IMAGELESS COMPUTER-ASSISTED NAVIGATION (Left) Patient reports pain as mild.   Patient seen in rounds with Dr. Aubry Blase. Patient is well, and has had no acute complaints or problems Denies any CP, SOB, N/V, fevers or chills We will start therapy today.  Plan is to go Home after hospital stay.  Objective: Vital signs in last 24 hours: Temp:  [96.9 F (36.1 C)-98.5 F (36.9 C)] 98.5 F (36.9 C) (06/03 0745) Pulse Rate:  [66-91] 82 (06/03 0745) Resp:  [13-18] 18 (06/03 0745) BP: (85-135)/(50-74) 135/74 (06/03 0745) SpO2:  [93 %-98 %] 98 % (06/03 0745)  Intake/Output from previous [facility]:  Intake/Output Summary (Last 24 hours) at 02/14/2024 0844 Last data filed at 02/14/2024 0405 Gross per 24 hour  Intake 1130 ml  Output 1375 ml  Net -245 ml    Intake/Output this shift: No intake/output data recorded.  Labs: No results for input(s): "HGB" in the last 72 hours. No results for input(s): "WBC", "RBC", "HCT", "PLT" in the last 72 hours. No results for input(s): "NA", "K", "CL", "CO2", "BUN", "CREATININE", "GLUCOSE", "CALCIUM" in the last 72 hours. No results for input(s): "LABPT", "INR" in the last 72 hours.  EXAM General - Patient is Alert, Appropriate, and Oriented Extremity - Neurologically intact Neurovascular intact Sensation intact distally Intact pulses distally Dorsiflexion/Plantar flexion intact No cellulitis present Compartment soft Dressing - dressing C/D/I and no drainage Motor Function - intact, moving foot and toes well on exam. JP Drain pulled without difficulty. Intact  Past Medical History:  Diagnosis Date   Anxiety    Chronic back pain    COVID    Depression    Elevated homocysteine    Family history of adverse reaction to anesthesia    mom-delayed emergence   History of kidney stones    Hyperlipidemia    Leg edema    Lesion of skin of breast    Memory loss, short term     Sleep apnea    does not use cpap    Assessment/Plan: 1 [place] Post-Op Procedure(s) (LRB): ARTHROPLASTY, KNEE, TOTAL, USING IMAGELESS COMPUTER-ASSISTED NAVIGATION (Left) Principal Problem:   History of total knee arthroplasty, left  Estimated body mass index is 39.53 kg/m as calculated from the following:   Height as of this encounter: 5\' 8"  (1.727 m).   Weight as of this encounter: 117.9 kg. Advance diet Up with therapy  Patient will continue to work with physical therapy to pass postoperative PT protocols, ROM and strengthening  Discussed with the patient continuing to utilize Polar Care  Patient will use bone foam in 20-30 minute intervals  Patient will wear TED hose bilaterally to help prevent DVT and clot formation  Discussed the Aquacel bandage.  This bandage will stay in place 7 days postoperatively.  Can be replaced with honeycomb bandages that will be sent home with the patient  Discussed sending the patient home with tramadol and oxycodone  for as needed pain management.  Patient will continue on at home meloxicam  for swelling and inflammation.  Patient will take an 81 mg aspirin twice daily for DVT prophylaxis  JP drain removed without difficulty, intact  Weight-Bearing as tolerated to left leg  Patient will follow-up with [facility] in 2 weeks for staple removal and reevaluation  Wadie Guile, PA-C [facility] 02/14/2024, 8:44 AM

## 2024-02-13 NOTE — Anesthesia Procedure Notes (Addendum)
 Spinal  Patient location during procedure: OR Start time: 02/13/2024 7:15 AM End time: 02/13/2024 7:20 AM Reason for block: surgical anesthesia Staffing Performed: resident/CRNA  Anesthesiologist: Enrique Harvest, MD Resident/CRNA: Jean Michaelis., CRNA Performed by: Jean Michaelis., CRNA Authorized by: Enrique Harvest, MD   Preanesthetic Checklist Completed: patient identified, IV checked, site marked, risks and benefits discussed, surgical consent, monitors and equipment checked, pre-op evaluation and timeout performed Spinal Block Patient position: sitting Prep: Betadine Patient monitoring: heart rate, continuous pulse ox, blood pressure and cardiac monitor Approach: midline Location: L4-5 Injection technique: single-shot Needle Needle type: Whitacre and Introducer  Needle gauge: 24 G Needle length: 9 cm Assessment Events: CSF return Additional Notes Negative paresthesia. Negative blood return. Positive free-flowing CSF. Expiration date of kit checked and confirmed. Patient tolerated procedure well, without complications.

## 2024-02-13 NOTE — Op Note (Signed)
 OPERATIVE NOTE  DATE OF SURGERY:  02/13/2024  PATIENT NAME:  Erika Roberts   DOB: [birth year]  MRN: [id number]  PRE-OPERATIVE DIAGNOSIS: Degenerative arthrosis of the left knee, primary  POST-OPERATIVE DIAGNOSIS:  Same  PROCEDURE:  Left total knee arthroplasty using computer-assisted navigation  SURGEON:  Maxene Span. M.D.  ASSISTANT:  Benjiman Bras, PA-C (present and scrubbed throughout the case, critical for assistance with exposure, retraction, instrumentation, and closure)  ANESTHESIA: spinal  ESTIMATED BLOOD LOSS: 50 mL  FLUIDS REPLACED: 700 mL of crystalloid  TOURNIQUET TIME: 93 minutes  DRAINS: 2 medium Hemovac drains  SOFT TISSUE RELEASES: Anterior cruciate ligament, posterior cruciate ligament, deep medial collateral ligament, patellofemoral ligament  IMPLANTS UTILIZED: DePuy Attune size 5N posterior stabilized femoral component (cemented), size 4 rotating platform tibial component (cemented), 35 mm medialized dome patella (cemented), and a 5 mm stabilized rotating platform polyethylene insert.  INDICATIONS FOR SURGERY: Erika Roberts is a 53 y.o. year old female with a long history of progressive knee pain. X-rays demonstrated severe degenerative changes in tricompartmental fashion. The patient had not seen any significant improvement despite conservative nonsurgical intervention. After discussion of the risks and benefits of surgical intervention, the patient expressed understanding of the risks benefits and agree with plans for total knee arthroplasty.   The risks, benefits, and alternatives were discussed at length including but not limited to the risks of infection, bleeding, nerve injury, stiffness, blood clots, the need for revision surgery, cardiopulmonary complications, among others, and they were willing to proceed.  PROCEDURE IN DETAIL: The patient was brought into the operating room and, after adequate spinal anesthesia was achieved, a tourniquet was placed on  the patient's upper thigh. The patient's knee and leg were cleaned and prepped with alcohol and DuraPrep and draped in the usual sterile fashion. A "timeout" was performed as per usual protocol. The lower extremity was exsanguinated using an Esmarch, and the tourniquet was inflated to 300 mmHg. An anterior longitudinal incision was made followed by a standard mid vastus approach. The deep fibers of the medial collateral ligament were elevated in a subperiosteal fashion off of the medial flare of the tibia so as to maintain a continuous soft tissue sleeve. The patella was subluxed laterally and the patellofemoral ligament was incised. Inspection of the knee demonstrated severe degenerative changes with full-thickness loss of articular cartilage. Osteophytes were debrided using a rongeur. Anterior and posterior cruciate ligaments were excised. Two 4.0 mm Schanz pins were inserted in the femur and into the tibia for attachment of the array of trackers used for computer-assisted navigation. Hip center was identified using a circumduction technique. Distal landmarks were mapped using the computer. The distal femur and proximal tibia were mapped using the computer. The distal femoral cutting guide was positioned using computer-assisted navigation so as to achieve a 5 distal valgus cut. The femur was sized and it was felt that a size 5N femoral component was appropriate. A size 5 femoral cutting guide was positioned and the anterior cut was performed and verified using the computer. This was followed by completion of the posterior and chamfer cuts. Femoral cutting guide for the central box was then positioned in the center box cut was performed.  Attention was then directed to the proximal tibia. Medial and lateral menisci were excised. The extramedullary tibial cutting guide was positioned using computer-assisted navigation so as to achieve a 0 varus-valgus alignment and 3 posterior slope. The cut was performed and  verified using the computer. The proximal tibia  was sized and it was felt that a size 4 tibial tray was appropriate. Tibial and femoral trials were inserted followed by insertion of a 5 mm polyethylene insert. This allowed for excellent mediolateral soft tissue balancing both in flexion and in full extension. Finally, the patella was cut and prepared so as to accommodate a 35 mm medialized dome patella. A patella trial was placed and the knee was placed through a range of motion with excellent patellar tracking appreciated. The femoral trial was removed after debridement of posterior osteophytes. The central post-hole for the tibial component was reamed followed by insertion of a keel punch. Tibial trials were then removed. Cut surfaces of bone were irrigated with copious amounts of normal saline using pulsatile lavage and then suctioned dry. Polymethylmethacrylate cement with gentamicin was prepared in the usual fashion using a vacuum mixer. Cement was applied to the cut surface of the proximal tibia as well as along the undersurface of a size 4 rotating platform tibial component. Tibial component was positioned and impacted into place. Excess cement was removed using Personal assistant. Cement was then applied to the cut surfaces of the femur as well as along the posterior flanges of the size 5N femoral component. The femoral component was positioned and impacted into place. Excess cement was removed using Personal assistant. A 5 mm polyethylene trial was inserted and the knee was brought into full extension with steady axial compression applied. Finally, cement was applied to the backside of a 35 mm medialized dome patella and the patellar component was positioned and patellar clamp applied. Excess cement was removed using Personal assistant. After adequate curing of the cement, the tourniquet was deflated after a total tourniquet time of 93 minutes. Hemostasis was achieved using electrocautery. The knee was irrigated  with copious amounts of normal saline using pulsatile lavage followed by 450 ml of Surgiphor and then suctioned dry. 20 mL of 1.3% Exparel  and 60 mL of 0.25% Marcaine  in 40 mL of normal saline was injected along the posterior capsule, medial and lateral gutters, and along the arthrotomy site. A 5 mm stabilized rotating platform polyethylene insert was inserted and the knee was placed through a range of motion with excellent mediolateral soft tissue balancing appreciated and excellent patellar tracking noted. 2 medium drains were placed in the wound bed and brought out through separate stab incisions. The medial parapatellar portion of the incision was reapproximated using interrupted sutures of #1 Vicryl. Subcutaneous tissue was approximated in layers using first #0 Vicryl followed #2-0 Vicryl. The skin was approximated with skin staples. A sterile dressing was applied.  The patient tolerated the procedure well and was transported to the recovery room in stable condition.    Ahkeem Goede P. Fleur Audino, Jr., M.D.

## 2024-02-13 NOTE — Evaluation (Signed)
 Physical Therapy Evaluation Patient Details Name: Erika Roberts MRN: [id number] DOB: 1971/01/23 Today's Date: 02/13/2024  History of Present Illness  Pt is s/p L TKA on 02/13/24.  Clinical Impression  Pt admitted with above diagnosis. Pt currently with functional limitations due to the deficits listed below (see PT Problem List). Pt received upright in bed agreeable to PT with wife and MIL present. Pt reports PTA being fully independent with mobility and ADL's.   To date, pt educated on WB status and L knee positioning to avoid flexion contracture. Provided HEP hand out with education on reps/sets/frequency. Pt with excellent ankle ROM, normal sensation to LT and able to SLR without quad lag. Pt exits bed mod-I and performs STS at [facility] with VC's to hand placement. Pt reports need to urinate ambulating CGA transitioning to supervision to toilet being indep with urination, pericare, and hand hygiene at sink. Supervision for STS to grab bar from toilet. Pt returning to recliner with safe sitting and L knee in extension. ROM grossly 10-48 degrees. Anticipate pt will be safe with d/c recs pending stairs training and gait progression to address acute ROM, strength, and gait deficits. Pt with all needs in reach and polar care applied.       If plan is discharge home, recommend the following: A little help with walking and/or transfers;Assistance with cooking/housework;Assist for transportation;Help with stairs or ramp for entrance;A little help with bathing/dressing/bathroom   Can travel by private vehicle        Equipment Recommendations None recommended by PT (has needed DME)  Recommendations for Other Services       Functional Status Assessment Patient has had a recent decline in their functional status and demonstrates the ability to make significant improvements in function in a reasonable and predictable amount of time.     Precautions / Restrictions Precautions Precautions: Knee Precaution  Booklet Issued: Yes (comment) Recall of Precautions/Restrictions: Intact Restrictions Weight Bearing Restrictions Per Provider Order: Yes LLE Weight Bearing Per Provider Order: Weight bearing as tolerated      Mobility  Bed Mobility Overal bed mobility: Modified Independent               Patient Response: Cooperative  Transfers Overall transfer level: Needs assistance Equipment used: Rolling walker (2 wheels) Transfers: Sit to/from Stand Sit to Stand: Contact guard assist           General transfer comment: VC's for hand placement    Ambulation/Gait Ambulation/Gait assistance: Supervision Gait Distance (Feet): 25 Feet Assistive device: Rolling walker (2 wheels) Gait Pattern/deviations: Step-to pattern, Decreased stance time - left, Decreased step length - right       General Gait Details: L antalgic gait. Good stability on LLE in stance phase with no buckling.  Stairs            Wheelchair Mobility     Tilt Bed Tilt Bed Patient Response: Cooperative  Modified Rankin (Stroke Patients Only)       Balance Overall balance assessment: Needs assistance Sitting-balance support: No upper extremity supported, Feet supported Sitting balance-Leahy Scale: Good     Standing balance support: Bilateral upper extremity supported Standing balance-Leahy Scale: Fair Standing balance comment: using RW                             Pertinent Vitals/Pain Pain Assessment Pain Assessment: 0-10 Pain Score: 6  Pain Location: L knee Pain Descriptors / Indicators: Aching, Discomfort, Grimacing Pain Intervention(s):  Limited activity within patient's tolerance, Monitored during session, Premedicated before session, Repositioned, Ice applied    Home Living Family/patient expects to be discharged to:: Private residence Living Arrangements: Spouse/significant other Available Help at Discharge: Family;Available 24 hours/[place] Type of Home: House Home Access:  Ramped entrance       Home Layout: One level Home Equipment: Rolling Walker (2 wheels);BSC/3in1;Grab bars - tub/shower;Hand held shower head;Shower seat - built in      Prior Function Prior Level of Function : Independent/Modified Independent                     Extremity/Trunk Assessment   Upper Extremity Assessment Upper Extremity Assessment: Overall WFL for tasks assessed    Lower Extremity Assessment Lower Extremity Assessment: Generalized weakness;LLE deficits/detail LLE Deficits / Details: expected ROM and strength deficits after TKA LLE Sensation: WNL       Communication   Communication Communication: No apparent difficulties    Cognition Arousal: Alert Behavior During Therapy: WFL for tasks assessed/performed   PT - Cognitive impairments: No apparent impairments                         Following commands: Intact       Cueing Cueing Techniques: Verbal cues     General Comments      Exercises Total Joint Exercises Ankle Circles/Pumps: AROM, Strengthening, Both, 15 reps, Supine Quad Sets: AROM, Strengthening, Left, 10 reps, Supine Heel Slides: AROM, Strengthening, Left, 5 reps Hip ABduction/ADduction: AROM, Strengthening, Left, 5 reps, Supine Straight Leg Raises: AROM, Strengthening, Left, 5 reps, Supine Long Arc Quad: AROM, Strengthening, Left, 5 reps, Seated Goniometric ROM: 10-48 Other Exercises Other Exercises: Role of PT in acute setting, d/c recs, polar care, WB status, L knee positioning at rest, HEP (reps/sets/frequency)   Assessment/Plan    PT Assessment Patient needs continued PT services  PT Problem List Decreased strength;Decreased mobility;Decreased range of motion;Pain       PT Treatment Interventions DME instruction;Therapeutic exercise;Gait training;Balance training;Stair training;Neuromuscular re-education;Functional mobility training;Therapeutic activities;Patient/family education    PT Goals (Current goals can  be found in the Care Plan section)  Acute Rehab PT Goals Patient Stated Goal: improve pain and go home PT Goal Formulation: With patient Time For Goal Achievement: 02/27/24 Potential to Achieve Goals: Good    Frequency BID     Co-evaluation               AM-PAC PT "6 Clicks" Mobility  Outcome Measure Help needed turning from your back to your side while in a flat bed without using bedrails?: None Help needed moving from lying on your back to sitting on the side of a flat bed without using bedrails?: None Help needed moving to and from a bed to a chair (including a wheelchair)?: A Little Help needed standing up from a chair using your arms (e.g., wheelchair or bedside chair)?: A Little Help needed to walk in hospital room?: A Little Help needed climbing 3-5 steps with a railing? : A Little 6 Click Score: 20    End of Session Equipment Utilized During Treatment: Gait belt Activity Tolerance: Patient tolerated treatment well Patient left: in chair;with call bell/phone within reach;with chair alarm set;with family/visitor present;with SCD's reapplied Nurse Communication: Mobility status PT Visit Diagnosis: Other abnormalities of gait and mobility (R26.89);Muscle weakness (generalized) (M62.81);Difficulty in walking, not elsewhere classified (R26.2);Pain Pain - Right/Left: Left Pain - part of body: Knee    Time: 1478-2956 PT Time  Calculation (min) (ACUTE ONLY): 25 min   Charges:   PT Evaluation $PT Eval Low Complexity: 1 Low PT Treatments $Therapeutic Exercise: 8-22 mins PT General Charges $$ ACUTE PT VISIT: 1 Visit        Marc Senior. Fairly IV, PT, DPT Physical Therapist- [place]  [facility] 02/13/2024, 3:26 PM

## 2024-02-14 ENCOUNTER — Encounter: Payer: Self-pay | Admitting: Orthopedic Surgery

## 2024-02-14 DIAGNOSIS — M1712 Unilateral primary osteoarthritis, left knee: Secondary | ICD-10-CM | POA: Diagnosis not present

## 2024-02-14 MED ORDER — ACETAMINOPHEN 10 MG/ML IV SOLN
INTRAVENOUS | Status: AC
Start: 2024-02-14 — End: ?
  Filled 2024-02-14: qty 100

## 2024-02-14 MED ORDER — ACETAMINOPHEN 10 MG/ML IV SOLN
INTRAVENOUS | Status: AC
  Filled 2024-02-14: qty 100

## 2024-02-14 MED ORDER — TRAMADOL HCL 50 MG PO TABS: 50.0000 mg | ORAL_TABLET | ORAL | 0 refills | Status: AC | PRN

## 2024-02-14 MED ORDER — OXYCODONE HCL 5 MG PO TABS
5.0000 mg | ORAL_TABLET | ORAL | 0 refills | Status: AC | PRN
Start: 2024-02-14 — End: ?

## 2024-02-14 MED ORDER — ASPIRIN 81 MG PO CHEW: 81.0000 mg | CHEWABLE_TABLET | Freq: Two times a day (BID) | ORAL | Status: AC

## 2024-02-14 NOTE — TOC Initial Note (Signed)
 Transition of Care [facility]) - Initial/Assessment Note    Patient Details  Name: Erika Roberts MRN: [id number] Date of Birth: September 04, 1971  Transition of Care [facility]) CM/SW Contact:    Alexandra Ice, RN Phone Number: 02/14/2024, 8:20 AM  Clinical Narrative:                  Patient lives with spouse, Buddie Carina, in single story home. Home DME: ramp accessible, RW, BSC, grab-bars, built in shower chair. She has PCP established. Patient set up with [facility] by surgeon's office, prior to surgery. Information added to AVS.        Patient Goals and CMS Choice            Expected Discharge Plan and Services                                              Prior Living Arrangements/Services                       Activities of Daily Living   ADL Screening (condition at time of admission) Independently performs ADLs?: Yes (appropriate for developmental age) Is the patient deaf or have difficulty hearing?: No Does the patient have difficulty seeing, even when wearing glasses/contacts?: No Does the patient have difficulty concentrating, remembering, or making decisions?: No  Permission Sought/Granted                  Emotional Assessment              Admission diagnosis:  Primary osteoarthritis of left knee [M17.12] History of total knee arthroplasty, left [Z96.652] Patient Active Problem List   Diagnosis Date Noted   History of total knee arthroplasty, left 02/13/2024   Primary osteoarthritis of left knee 12/10/2023   Left knee pain 08/15/2023   Rectal urgency 06/13/2023   Loose stools 06/13/2023   Lumbar radiculopathy 05/06/2023   Bilateral lower extremity edema 04/11/2023   Symptomatic varicose veins, bilateral 04/11/2023   Change in bowel habits 01/17/2023   Diarrhea 01/17/2023   Incontinence of feces 01/17/2023   Class 1 obesity due to excess calories with serious comorbidity and body mass index (BMI) of 34.0 to 34.9 in adult 05/04/2022    Flat feet 05/04/2022   Meralgia paresthetica of right side 10/15/2019   Calculus of kidney 07/06/2019   Chronic low back pain without sciatica 07/06/2019   Carpal tunnel syndrome of left wrist 01/22/2019   Sciatica of right side 01/22/2019   Dyslipidemia 01/04/2018   Achilles tendinitis of right lower extremity 06/25/2016   SOB (shortness of breath) 06/25/2016   Stress at home 06/25/2016   PCP:  Merilynn Stapler, MD Pharmacy:   [facility] [phone] [place], [place] - [address] AT [facility] [address] [place] [address] [place] [address] Phone: [phone] Fax: [phone]     Social Drivers of Health (SDOH) Social History: SDOH Screenings   Food Insecurity: No Food Insecurity (02/13/2024)  Housing: Low Risk  (02/13/2024)  Transportation Needs: No Transportation Needs (02/13/2024)  Utilities: Not At Risk (02/13/2024)  Financial Resource Strain: Low Risk  (12/12/2023)   Received from [facility]  Social Connections: Unknown (02/01/2021)   Received from [facility]  Tobacco Use: Low Risk  (02/13/2024)  Health Literacy:  Unknown (02/01/2021)   [facility] from [facility]   SDOH Interventions:     Readmission Risk Interventions     No data to display

## 2024-02-14 NOTE — Plan of Care (Signed)
  Problem: Education: Goal: Knowledge of the prescribed therapeutic regimen will improve Outcome: Progressing   Problem: Activity: Goal: Ability to avoid complications of mobility impairment will improve Outcome: Progressing Goal: Range of joint motion will improve Outcome: Progressing   Problem: Pain Management: Goal: Pain level will decrease with appropriate interventions Outcome: Progressing   

## 2024-02-14 NOTE — Anesthesia Postprocedure Evaluation (Signed)
 Anesthesia Post Note  Patient: Erika Roberts  Procedure(s) Performed: ARTHROPLASTY, KNEE, TOTAL, USING IMAGELESS COMPUTER-ASSISTED NAVIGATION (Left: Knee)  Patient location during evaluation: Nursing Unit Anesthesia Type: Spinal Level of consciousness: oriented and awake and alert Pain management: pain level controlled Vital Signs Assessment: post-procedure vital signs reviewed and stable Respiratory status: spontaneous breathing and respiratory function stable Cardiovascular status: blood pressure returned to baseline and stable Postop Assessment: no headache, no backache, no apparent nausea or vomiting and patient able to bend at knees Anesthetic complications: no   There were no known notable events for this encounter.   Last Vitals:  Vitals:   02/13/24 2338 02/14/24 0401  BP: 127/73 110/74  Pulse: 80 78  Resp: 18 18  Temp: 36.4 C 36.6 C  SpO2: 97% 96%    Last Pain:  Vitals:   02/14/24 0401  TempSrc: Temporal  PainSc:                  Iven Mark

## 2024-02-14 NOTE — Evaluation (Signed)
 Occupational Therapy Evaluation Patient Details Name: Erika Roberts MRN: [id number] DOB: [birth year] Today's Date: 02/14/2024   History of Present Illness   Pt is s/p L TKA on 02/13/24.     Clinical Impressions Upon entering the room, pt supine in bed with supportive spouse present in room. Pt was Ind with all aspects of care and mobility PTA. Pt will have support from family once home as needed. Pt is knowledgeable about polar care system and demonstrates ability to don/doff independently this session. Pt performs bed mobility with increased time and effort but no physical assistance. Therapist assists pt with donning TED hose. She dons UB clothing without assistance. Min cues for technique for LB dressing and pt stands with supervision to pull over B hips. Assistance to don L shoe as well. All questions answered and no further concerns at this time. Pt does not need skilled acute OT intervention. OT to complete orders.      If plan is discharge home, recommend the following:   A little help with bathing/dressing/bathroom;Assistance with cooking/housework;Assist for transportation;Help with stairs or ramp for entrance     Functional Status Assessment         Equipment Recommendations   Other (comment) (has all needed equipment)      Precautions/Restrictions   Precautions Precautions: Knee Recall of Precautions/Restrictions: Intact Restrictions Weight Bearing Restrictions Per Provider Order: Yes LLE Weight Bearing Per Provider Order: Weight bearing as tolerated     Mobility Bed Mobility Overal bed mobility: Modified Independent             General bed mobility comments: increased time and effort but no physical assistance    Transfers Overall transfer level: Needs assistance Equipment used: Rolling walker (2 wheels) Transfers: Sit to/from Stand Sit to Stand: Supervision           General transfer comment: no physical assist to stand from bed height that  simulated home environment      Balance Overall balance assessment: Needs assistance Sitting-balance support: Feet supported Sitting balance-Leahy Scale: Good     Standing balance support: Bilateral upper extremity supported, During functional activity, Reliant on assistive device for balance Standing balance-Leahy Scale: Good                             ADL either performed or assessed with clinical judgement   ADL Overall ADL's : Needs assistance/impaired                     Lower Body Dressing: Minimal assistance Lower Body Dressing Details (indicate cue type and reason): assistance with TED hose and L shoe but pt able to don underwear and shorts with cues for technique Toilet Transfer: Supervision/safety;Rolling walker (2 wheels) Toilet Transfer Details (indicate cue type and reason): simulated                 Vision Patient Visual Report: No change from baseline              Pertinent Vitals/Pain Pain Assessment Pain Assessment: Faces Faces Pain Scale: Hurts little more Pain Location: L knee Pain Descriptors / Indicators: Aching, Discomfort, Grimacing Pain Intervention(s): Monitored during session, Limited activity within patient's tolerance, Premedicated before session, Ice applied     Extremity/Trunk Assessment Upper Extremity Assessment Upper Extremity Assessment: Overall WFL for tasks assessed           Communication Communication Communication: No apparent difficulties   Cognition Arousal:  Alert Behavior During Therapy: [facility] for tasks assessed/performed                                 Following commands: Intact       Cueing  General Comments   Cueing Techniques: Verbal cues              Home Living Family/patient expects to be discharged to:: Private residence Living Arrangements: Spouse/significant other Available Help at Discharge: Family;Available 24 hours/[place] Type of Home: House Home Access:  Ramped entrance           Bathroom Shower/Tub: Walk-in shower   Bathroom Toilet: Standard Bathroom Accessibility: Yes   Home Equipment: Rolling Walker (2 wheels);BSC/3in1;Grab bars - tub/shower;Hand held shower head;Shower seat - built in          Prior Functioning/Environment Prior Level of Function : Independent/Modified Independent                      OT Goals(Current goals can be found in the care plan section)   Acute Rehab OT Goals Patient Stated Goal: to go home OT Goal Formulation: With patient/family Time For Goal Achievement: 02/14/24 Potential to Achieve Goals: Good   OT Frequency:          AM-PAC OT "6 Clicks" Daily Activity     Outcome Measure Help from another person eating meals?: None Help from another person taking care of personal grooming?: None Help from another person toileting, which includes using toliet, bedpan, or urinal?: None Help from another person bathing (including washing, rinsing, drying)?: A Little Help from another person to put on and taking off regular upper body clothing?: None Help from another person to put on and taking off regular lower body clothing?: A Little 6 Click Score: 22   End of Session Equipment Utilized During Treatment: Rolling walker (2 wheels) Nurse Communication: Mobility status  Activity Tolerance: Patient tolerated treatment well Patient left: in bed;with call bell/phone within reach;with family/visitor present                   Time: 1610-9604 OT Time Calculation (min): 20 min Charges:  OT General Charges $OT Visit: 1 Visit OT Evaluation $OT Eval Low Complexity: 1 Low  Erika Kinder, MS, OTR/L , CBIS ascom [phone]  02/14/24, 10:25 AM

## 2024-02-14 NOTE — Progress Notes (Signed)
 DISCHARGE NOTE:   Pt dc with IV removed and dc instructions given. Pt given 2 honeycomb dressings, medication scripts, polar care and extra pair of TED hose. Pt has both TED hose on and in place. Pt wheeled down to medical mall entrance by staff and pt's wife provided transportation.

## 2024-02-14 NOTE — TOC Transition Note (Signed)
 Transition of Care [facility]) - Discharge Note   Patient Details  Name: Erika Roberts MRN: [id number] Date of Birth: 06-06-1971  Transition of [facility]) CM/SW Contact:  Alexandra Ice, RN Phone Number: 02/14/2024, 9:48 AM   Clinical Narrative:     Patient to discharge home with home health. Patient has DME at home. Family to transport home. [place]  at [facility] aware patient discharging today. Agency information added to AVS.    Final next level of care: Home w Home Health Services Barriers to Discharge: Barriers Resolved   Patient Goals and CMS Choice            Discharge Placement                  Name of family member notified: Amalia Badder Patient and family notified of of transfer: 02/14/24  Discharge Plan and Services Additional resources added to the After Visit Summary for                    DME Agency: NA       HH Arranged: PT, OT HH Agency: [facility] Date [facility] Agency Contacted: 02/14/24 Time HH Agency Contacted: [phone] Representative spoke with at [facility] Agency: [place]   Social Drivers of Health (SDOH) Interventions SDOH Screenings   Food Insecurity: No Food Insecurity (02/13/2024)  Housing: Low Risk  (02/13/2024)  Transportation Needs: No Transportation Needs (02/13/2024)  Utilities: Not At Risk (02/13/2024)  Financial Resource Strain: Low Risk  (12/12/2023)   Received from [facility]  Social Connections: Unknown (02/01/2021)   Received from [facility], [facility]  Tobacco Use: Low Risk  (02/13/2024)  Health Literacy: Unknown (02/01/2021)   Received from [facility], [facility]     Readmission Risk Interventions     No data to display

## 2024-02-14 NOTE — Plan of Care (Signed)
  Problem: Activity: Goal: Ability to avoid complications of mobility impairment will improve Outcome: Progressing   

## 2024-02-14 NOTE — Progress Notes (Signed)
 Physical Therapy Treatment Patient Details Name: Erika Roberts MRN: [id number] DOB: [birth year] Today's Date: 02/14/2024   History of Present Illness Pt is s/p L TKA on 02/13/24.    PT Comments  Pt was long sitting in bed upon arrival. She is A and O x 4. Supportive partner present throughout. PT demonstrated safe abilities to exit bed, stand, and ambulated ~ 200 ft with RW. Pt was able to progress knee ROM however overall ROM limited by postoperative dressing. Pt was educated on importance of routine mobility, ROM, and strengthening HEP. Also reviewed car transfers and expectations from post acute PT. Pt is progressing well and cleared from an acute PT standpoint for safe DC home with HHPT to follow. Pt has no DME needs.    If plan is discharge home, recommend the following: A little help with walking and/or transfers;Assistance with cooking/housework;Assist for transportation;Help with stairs or ramp for entrance;A little help with bathing/dressing/bathroom     Equipment Recommendations  None recommended by PT (Pt has all DME needs met)       Precautions / Restrictions Precautions Precautions: Knee Precaution Booklet Issued: Yes (comment) Recall of Precautions/Restrictions: Intact Restrictions Weight Bearing Restrictions Per Provider Order: Yes LLE Weight Bearing Per Provider Order: Weight bearing as tolerated     Mobility  Bed Mobility Overal bed mobility: Modified Independent  General bed mobility comments: Pt used bed sheet to assist LLE out/in bed    Transfers Overall transfer level: Needs assistance Equipment used: Rolling walker (2 wheels) Transfers: Sit to/from Stand Sit to Stand: Supervision  General transfer comment: no physical assist to stand from bed height that simulated home environment    Ambulation/Gait Ambulation/Gait assistance: Supervision Gait Distance (Feet): 200 Feet Assistive device: Rolling walker (2 wheels) Gait Pattern/deviations: Step-through  pattern, Antalgic Gait velocity: decreased  General Gait Details: L antalgic gait. Good stability on LLE in stance phase with no buckling.   Stairs Stairs: Yes Stairs assistance: Supervision Stair Management: Two rails, Step to pattern Number of Stairs: 4 General stair comments: Pt easily and safely able to ascend/descend stairs without difficulty or safety concerns.    Balance Overall balance assessment: Needs assistance Sitting-balance support: Feet supported Sitting balance-Leahy Scale: Good     Standing balance support: Bilateral upper extremity supported, During functional activity, Reliant on assistive device for balance Standing balance-Leahy Scale: Good Standing balance comment: no LOB or safety concerns with dynamic standing with UE support    Communication Communication Communication: No apparent difficulties  Cognition Arousal: Alert Behavior During Therapy: WFL for tasks assessed/performed   PT - Cognitive impairments: No apparent impairments      PT - Cognition Comments: Pt is A and O x 4 Following commands: Intact      Cueing Cueing Techniques: Verbal cues  Exercises Total Joint Exercises Goniometric ROM: 2-60 (limited by dressing)        Pertinent Vitals/Pain Pain Assessment Pain Assessment: 0-10 Pain Score: 7  Pain Location: L knee Pain Descriptors / Indicators: Aching, Discomfort, Grimacing Pain Intervention(s): Limited activity within patient's tolerance, Monitored during session, Premedicated before session, Repositioned, Ice applied     PT Goals (current goals can now be found in the care plan section) Acute Rehab PT Goals Patient Stated Goal: go home Progress towards PT goals: Progressing toward goals    Frequency    BID       AM-PAC PT "6 Clicks" Mobility   Outcome Measure  Help needed turning from your back to your side while in a  flat bed without using bedrails?: None Help needed moving from lying on your back to sitting on  the side of a flat bed without using bedrails?: None Help needed moving to and from a bed to a chair (including a wheelchair)?: A Little Help needed standing up from a chair using your arms (e.g., wheelchair or bedside chair)?: A Little Help needed to walk in hospital room?: A Little Help needed climbing 3-5 steps with a railing? : A Little 6 Click Score: 20    End of Session   Activity Tolerance: Patient tolerated treatment well Patient left: in bed;with call bell/phone within reach;with bed alarm set Nurse Communication: Mobility status PT Visit Diagnosis: Other abnormalities of gait and mobility (R26.89);Muscle weakness (generalized) (M62.81);Difficulty in walking, not elsewhere classified (R26.2);Pain Pain - Right/Left: Left Pain - part of body: Knee     Time: 0726-0750 PT Time Calculation (min) (ACUTE ONLY): 24 min  Charges:    $Gait Training: 8-22 mins $Therapeutic Exercise: 8-22 mins PT General Charges $$ ACUTE PT VISIT: 1 Visit                     Chester Costa PTA 02/14/24, 8:05 AM

## 2024-08-05 ENCOUNTER — Ambulatory Visit (INDEPENDENT_AMBULATORY_CARE_PROVIDER_SITE_OTHER)

## 2024-08-05 ENCOUNTER — Ambulatory Visit
Admission: EM | Admit: 2024-08-05 | Discharge: 2024-08-05 | Disposition: A | Discharge status: Home / Self Care | Attending: Emergency Medicine | Admitting: Emergency Medicine

## 2024-08-05 ENCOUNTER — Encounter: Payer: Self-pay | Admitting: Emergency Medicine

## 2024-08-05 DIAGNOSIS — R051 Acute cough: Secondary | ICD-10-CM

## 2024-08-05 DIAGNOSIS — J029 Acute pharyngitis, unspecified: Secondary | ICD-10-CM

## 2024-08-05 DIAGNOSIS — J209 Acute bronchitis, unspecified: Secondary | ICD-10-CM | POA: Diagnosis not present

## 2024-08-05 LAB — POCT RAPID STREP A (OFFICE): Rapid Strep A Screen: NEGATIVE

## 2024-08-05 LAB — POC SOFIA SARS ANTIGEN FIA: SARS Coronavirus 2 Ag: NEGATIVE

## 2024-08-05 MED ORDER — PREDNISONE 20 MG PO TABS: 40.0000 mg | ORAL_TABLET | Freq: Every day | ORAL | 0 refills | Status: AC

## 2024-08-05 MED ORDER — FLUTICASONE PROPIONATE 50 MCG/ACT NA SUSP: 2.0000 | Freq: Every day | NASAL | 0 refills | Status: AC

## 2024-08-05 MED ORDER — AEROCHAMBER MV MISC
1 refills | Status: AC
Start: 2024-08-05 — End: ?

## 2024-08-05 MED ORDER — IPRATROPIUM-ALBUTEROL 0.5-2.5 (3) MG/3ML IN SOLN
3.0000 mL | Freq: Once | RESPIRATORY_TRACT | Status: AC
  Administered 2024-08-05: 3 mL via RESPIRATORY_TRACT

## 2024-08-05 MED ORDER — PREDNISONE 20 MG PO TABS
40.0000 mg | ORAL_TABLET | Freq: Once | ORAL | Status: AC
  Administered 2024-08-05: 40 mg via ORAL

## 2024-08-05 MED ORDER — HYDROCOD POLI-CHLORPHE POLI ER 10-8 MG/5ML PO SUER: 5.0000 mL | Freq: Two times a day (BID) | ORAL | 0 refills | Status: AC | PRN

## 2024-08-05 MED ORDER — ALBUTEROL SULFATE HFA 108 (90 BASE) MCG/ACT IN AERS: 1.0000 | INHALATION_SPRAY | RESPIRATORY_TRACT | 0 refills | Status: AC | PRN

## 2024-08-05 MED ORDER — IBUPROFEN 600 MG PO TABS: 600.0000 mg | ORAL_TABLET | Freq: Four times a day (QID) | ORAL | 0 refills | Status: AC | PRN

## 2024-08-05 NOTE — Discharge Instructions (Signed)
 Take two puffs from your albuterol  inhaler with your spacer every 4 hours for 2 days, then every 6 hours for 2 days, then as needed. You can back off if you start to improve  sooner. Finish the steroids unless your doctor tells you to stop. You may start the steroids tomorrow, as you have already taken today's dose. Take tylenol  1 gram combined with  600 mg of motrin  up to 3-4 times a Gimpel as needed for pain. Make sure you drink extra fluids. Return to the ER if you get worse, have a fever >100.4, or any other concerns.  Tussionex for cough and chest soreness.  If the spacer is too expensive at the pharmacy, you can get an AeroChamber Z-Stat off of [employer] for about $10-$15.Flonase , Mucinex D, saline nasal irrigation with a NeilMed sinus rinse and distilled water as often as you want for the nasal congestion and postnasal drip.  Go to www.goodrx.com  or www.costplusdrugs.com to look up your medications. This will give you a list of where you can find your prescriptions at the most affordable prices. Or ask the pharmacist what the cash price is, or if they have any other discount programs available to help make your medication more affordable. This can be less expensive than what you would pay with insurance.

## 2024-08-05 NOTE — ED Triage Notes (Signed)
 Patient c/o cough, congestion, sore throat, and nasal congestion that started 5 days ago.  Patient reports low grade fever last night.

## 2024-08-05 NOTE — ED Provider Notes (Signed)
 HPI  SUBJECTIVE:  Erika Roberts is a 53 y.o. female who presents with 4 to 5 days of nasal congestion, yellow rhinorrhea, dry cough, chest congestion, sore throat, body aches, headaches, postnasal drip and wheezing when she lies down.  She is unable to sleep at night because of the cough.  She reports mild sinus pain and pressure.  No known fevers above 100.4, facial swelling, or dental pain, chest pain or shortness of breath, dyspnea on exertion.  No antibiotics in the past 3 months.  No antipyretic in the past 6 months.  She has tried Excedrin, Flonase , NyQuil, Mucinex DM without improvement in her symptoms.  Symptoms are worse with lying down.  Past medical history negative for pulmonary disease, smoking, diabetes, hypertension, chronic kidney disease.  [facility]: [facility]    Past Medical History:  Diagnosis Date   Anxiety    Chronic back pain    COVID    Depression    Elevated homocysteine    Family history of adverse reaction to anesthesia    mom-delayed emergence   History of kidney stones    Hyperlipidemia    Leg edema    Lesion of skin of breast    Memory loss, short term    Sleep apnea    does not use cpap    Past Surgical History:  Procedure Laterality Date   BREAST REDUCTION SURGERY     COLONOSCOPY     CYSTOSCOPY WITH HOLMIUM LASER LITHOTRIPSY Left    x2   KNEE ARTHROPLASTY Left 02/13/2024   Procedure: ARTHROPLASTY, KNEE, TOTAL, USING IMAGELESS COMPUTER-ASSISTED NAVIGATION;  Surgeon: Mardee Lynwood SQUIBB, MD;  Location: [facility];  Service: Orthopedics;  Laterality: Left;   LUMBAR LAMINECTOMY/DECOMPRESSION MICRODISCECTOMY Right 05/06/2023   Procedure: RIGHT L5-S1 MICRODISCECTOMY;  Surgeon: Clois Fret, MD;  Location: [facility];  Service: Neurosurgery;  Laterality: Right;   WRIST GANGLION EXCISION     bilateral wrist    Family History  Problem Relation Age of Onset   Cancer Mother        lung, breast, brain   Diabetes Father    Heart attack Father      Social History   Tobacco Use   Smoking status: Never   Smokeless tobacco: Never  Vaping Use   Vaping status: Never Used  Substance Use Topics   Alcohol use: Yes    Alcohol/week: 6.0 standard drinks of alcohol    Types: 5 Cans of beer, 1 Standard drinks or equivalent per week    Comment: occasionally   Drug use: Never    No current facility-administered medications for this encounter.  Current Outpatient Medications:    albuterol  (VENTOLIN  HFA) 108 (90 Base) MCG/ACT inhaler, Inhale 1-2 puffs into the lungs every 4 (four) hours as needed for wheezing or shortness of breath., Disp: 1 each, Rfl: 0   chlorpheniramine-HYDROcodone (TUSSIONEX) 10-8 MG/5ML, Take 5 mLs by mouth every 12 (twelve) hours as needed for cough., Disp: 60 mL, Rfl: 0   fluticasone  (FLONASE ) 50 MCG/ACT nasal spray, Place 2 sprays into both nostrils daily., Disp: 16 g, Rfl: 0   ibuprofen  (ADVIL ) 600 MG tablet, Take 1 tablet (600 mg total) by mouth every 6 (six) hours as needed., Disp: 30 tablet, Rfl: 0   predniSONE  (DELTASONE ) 20 MG tablet, Take 2 tablets (40 mg total) by mouth daily with breakfast for 5 days., Disp: 10 tablet, Rfl: 0   Spacer/Aero-Holding Chambers (AEROCHAMBER MV) inhaler, Use as instructed, Disp: 1 each, Rfl: 1  acetaminophen  (TYLENOL ) 500 MG tablet, Take 1,000 mg by mouth every 6 (six) hours as needed., Disp: , Rfl:    aspirin  81 MG chewable tablet, Chew 1 tablet (81 mg total) by mouth 2 (two) times daily., Disp: , Rfl:    cholestyramine  light (PREVALITE ) 4 g packet, Take 2 g by mouth 2 (two) times daily., Disp: , Rfl:    [Paused] clonazePAM  (KLONOPIN ) 0.5 MG tablet, Take 0.5 mg by mouth 2 (two) times daily as needed for anxiety., Disp: , Rfl: 1   escitalopram  (LEXAPRO ) 20 MG tablet, Take 20 mg by mouth every morning., Disp: , Rfl:    furosemide  (LASIX ) 20 MG tablet, Take 20 mg by mouth daily as needed for edema., Disp: , Rfl:    metFORMIN  (GLUCOPHAGE ) 500 MG tablet, Take 1,000 mg by mouth daily.  Takes for weight loss, Disp: , Rfl:    Nutritional Supplements (COLD AND FLU PO), Take 2 tablets by mouth as needed., Disp: , Rfl:    [Paused] oxyCODONE  (OXY IR/ROXICODONE ) 5 MG immediate release tablet, Take 1 tablet (5 mg total) by mouth every 4 (four) hours as needed for moderate pain (pain score 4-6) (pain score 4-6)., Disp: 30 tablet, Rfl: 0   [Paused] Potassium Chloride  ER 20 MEQ TBCR, Take 20 mEq by mouth daily as needed (when taking lasix )., Disp: , Rfl:    [Paused] traMADol  (ULTRAM ) 50 MG tablet, Take 1-2 tablets (50-100 mg total) by mouth every 4 (four) hours as needed for moderate pain (pain score 4-6)., Disp: 30 tablet, Rfl: 0  Allergies  Allergen Reactions   Bee Venom Anaphylaxis   Penicillin G Hives    IgE = 30 (WNL) on 01/31/2024   Gabapentin Swelling and Other (See Comments)    High blood pressure Peripheral swelling     ROS  As noted in HPI.   Physical Exam  BP (!) 156/84 (BP Location: Right Arm)   Pulse 77   Temp 98.6 F (37 C) (Oral)   Resp 15   Ht 5' 8 (1.727 m)   Wt 108.9 kg   LMP 10/06/2020 (Approximate)   SpO2 96%   BMI 36.49 kg/m   Constitutional: Well developed, well nourished, no acute distress.  Wet cough.   Eyes: PERRL, EOMI, conjunctiva normal bilaterally.   HENT: Normocephalic, atraumatic,mucus membranes moist. Erythematous, but nonedematous nasal mucosa.  Clear nasal congestion.  Positive frontal sinus tenderness.  Positive maxillary sinus tenderness.  Normal tonsils without exudates.  Uvula midline.  Positive postnasal drip Neck: No cervical lymphadenopathy Respiratory: Poor air movement.  Faint crackles in the base of the right lower lobe.  Positive anterior chest wall tenderness. Cardiovascular: Normal rate and rhythm, no murmurs, no gallops, no rubs GI: nondistended skin: No rash, skin intact Musculoskeletal: no deformities Neurologic: Alert & oriented x 3, CN III-XII grossly intact, no motor deficits, sensation grossly  intact Psychiatric: Speech and behavior appropriate   ED Course   Medications  ipratropium-albuterol  (DUONEB) 0.5-2.5 (3) MG/3ML nebulizer solution 3 mL (3 mLs Nebulization Given 08/05/24 1303)  predniSONE  (DELTASONE ) tablet 40 mg (40 mg Oral Given 08/05/24 1240)    Orders Placed This Encounter  Procedures   DG Chest 2 View    Standing Status:   Standing    Number of Occurrences:   1    Reason for Exam (SYMPTOM  OR DIAGNOSIS REQUIRED):   5 days cough, crackles right lower lobe rule out pneumonia   POC rapid strep A    Standing Status:   Standing  Number of Occurrences:   1   POC SARS Coronavirus Ag    Standing Status:   Standing    Number of Occurrences:   1   Results for orders placed or performed during the hospital encounter of 08/05/24 (from the past 24 hours)  POC rapid strep A     Status: Normal   Collection Time: 08/05/24 11:38 AM  Result Value Ref Range   Rapid Strep A Screen Negative Negative  POC SARS Coronavirus Ag     Status: Normal   Collection Time: 08/05/24 11:45 AM  Result Value Ref Range   SARS Coronavirus 2 Ag Negative Negative   DG Chest 2 View Result Date: 08/05/2024 CLINICAL DATA:  Cough. EXAM: CHEST - 2 VIEW COMPARISON:  None Available. FINDINGS: The heart size and mediastinal contours are within normal limits. Both lungs are clear. The visualized skeletal structures are unremarkable. IMPRESSION: No active cardiopulmonary disease. Electronically Signed   By: Vanetta Chou M.D.   On: 08/05/2024 13:04    ED Clinical Impression  1. Acute bronchitis, unspecified organism   2. Sore throat   3. Acute cough      ED Assessment/Plan     Patient presents with cough and respiratory symptoms.  Suspect sore throat is from a cough.  COVID, rapid strep negative.  Discussed with patient while in department.  Will check chest x-ray to rule out pneumonia given focal lung findings, give a DuoNeb and 40 mg of prednisone .  Will reevaluate.  [place] [facility]  database reviewed for this patient, and feel that the risk/benefit ratio today is favorable for proceeding with a prescription for controlled substance.  Last opiate prescription in June 2025.  Reviewed imaging independently.  No acute cardiopulmonary disease as read by me and by radiology. See radiology report for full details.  On reevaluation, patient states she feels slightly better.  Improved air movement.  No crackles right lower lobe.  Treating as a bronchitis with regularly scheduled albuterol  inhaler with a spacer for 4 days and then as needed thereafter.  May back off on the albuterol  if starts to improve sooner.  Flonase , saline nasal irrigation, prednisone  40 mg for 5 days, Mucinex D, Tylenol  combined with ibuprofen  3-4 times a [place] as needed for pain.  Follow-up with PCP if not getting better after finishing the prednisone  for consideration of antibiotics.  ER return precautions given.  Discussed labs, imaging, MDM, treatment plan, and plan for follow-up with patient Discussed sn/sx that should prompt return to the ED. patient agrees with plan.   Meds ordered this encounter  Medications   ipratropium-albuterol  (DUONEB) 0.5-2.5 (3) MG/3ML nebulizer solution 3 mL   predniSONE  (DELTASONE ) tablet 40 mg   fluticasone  (FLONASE ) 50 MCG/ACT nasal spray    Sig: Place 2 sprays into both nostrils daily.    Dispense:  16 g    Refill:  0   albuterol  (VENTOLIN  HFA) 108 (90 Base) MCG/ACT inhaler    Sig: Inhale 1-2 puffs into the lungs every 4 (four) hours as needed for wheezing or shortness of breath.    Dispense:  1 each    Refill:  0   chlorpheniramine-HYDROcodone (TUSSIONEX) 10-8 MG/5ML    Sig: Take 5 mLs by mouth every 12 (twelve) hours as needed for cough.    Dispense:  60 mL    Refill:  0   Spacer/Aero-Holding Chambers (AEROCHAMBER MV) inhaler    Sig: Use as instructed    Dispense:  1 each    Refill:  1   ibuprofen  (ADVIL ) 600 MG tablet    Sig: Take 1 tablet (600 mg total) by mouth  every 6 (six) hours as needed.    Dispense:  30 tablet    Refill:  0   predniSONE  (DELTASONE ) 20 MG tablet    Sig: Take 2 tablets (40 mg total) by mouth daily with breakfast for 5 days.    Dispense:  10 tablet    Refill:  0      *This clinic note was created using Scientist, clinical (histocompatibility and immunogenetics). Therefore, there may be occasional mistakes despite careful proofreading. ?    Van Knee, MD 08/06/24 [phone]
